# Patient Record
Sex: Female | Born: 1985 | Race: Black or African American | Hispanic: No | Marital: Single | Smoking: Current every day smoker
Health system: Southern US, Community
[De-identification: ages and names within clinical notes are randomized; demographics above are authoritative.]

## PROBLEM LIST (undated history)

## (undated) DIAGNOSIS — I82409 Acute embolism and thrombosis of unspecified deep veins of unspecified lower extremity: Secondary | ICD-10-CM

## (undated) DIAGNOSIS — M109 Gout, unspecified: Secondary | ICD-10-CM

## (undated) DIAGNOSIS — I2699 Other pulmonary embolism without acute cor pulmonale: Secondary | ICD-10-CM

## (undated) HISTORY — PX: OTHER SURGICAL HISTORY: SHX169

---
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
## 2004-11-04 ENCOUNTER — Encounter: Admission: RE | Admit: 2004-11-04 | Discharge: 2004-11-04 | Payer: Self-pay | Admitting: Family Medicine

## 2005-07-13 ENCOUNTER — Ambulatory Visit: Payer: Self-pay | Admitting: Internal Medicine

## 2005-09-16 ENCOUNTER — Ambulatory Visit (HOSPITAL_COMMUNITY): Admission: RE | Admit: 2005-09-16 | Discharge: 2005-09-16 | Payer: Self-pay | Admitting: Obstetrics & Gynecology

## 2005-10-28 ENCOUNTER — Ambulatory Visit (HOSPITAL_COMMUNITY): Admission: RE | Admit: 2005-10-28 | Discharge: 2005-10-28 | Payer: Self-pay | Admitting: Obstetrics

## 2005-12-30 ENCOUNTER — Ambulatory Visit (HOSPITAL_COMMUNITY): Admission: RE | Admit: 2005-12-30 | Discharge: 2005-12-30 | Payer: Self-pay | Admitting: Obstetrics & Gynecology

## 2006-03-23 ENCOUNTER — Inpatient Hospital Stay (HOSPITAL_COMMUNITY): Admission: AD | Admit: 2006-03-23 | Discharge: 2006-03-27 | Payer: Self-pay | Admitting: Obstetrics & Gynecology

## 2006-03-23 ENCOUNTER — Encounter (INDEPENDENT_AMBULATORY_CARE_PROVIDER_SITE_OTHER): Payer: Self-pay | Admitting: *Deleted

## 2006-09-20 ENCOUNTER — Emergency Department (HOSPITAL_COMMUNITY): Admission: EM | Admit: 2006-09-20 | Discharge: 2006-09-20 | Payer: Self-pay | Admitting: Family Medicine

## 2008-11-10 ENCOUNTER — Inpatient Hospital Stay (HOSPITAL_COMMUNITY): Admission: EM | Admit: 2008-11-10 | Discharge: 2008-11-12 | Payer: Self-pay | Admitting: Emergency Medicine

## 2008-11-11 ENCOUNTER — Encounter (INDEPENDENT_AMBULATORY_CARE_PROVIDER_SITE_OTHER): Payer: Self-pay | Admitting: Internal Medicine

## 2008-11-12 ENCOUNTER — Ambulatory Visit: Payer: Self-pay | Admitting: Vascular Surgery

## 2008-11-20 DIAGNOSIS — E669 Obesity, unspecified: Secondary | ICD-10-CM | POA: Insufficient documentation

## 2008-11-20 DIAGNOSIS — I82409 Acute embolism and thrombosis of unspecified deep veins of unspecified lower extremity: Secondary | ICD-10-CM | POA: Insufficient documentation

## 2008-11-20 DIAGNOSIS — I2699 Other pulmonary embolism without acute cor pulmonale: Secondary | ICD-10-CM | POA: Insufficient documentation

## 2008-11-21 ENCOUNTER — Ambulatory Visit: Payer: Self-pay | Admitting: Critical Care Medicine

## 2008-11-21 LAB — CONVERTED CEMR LAB
AntiThromb III Func: 103 % (ref 76–126)
Anticardiolipin IgA: 5 (ref <10)
Anticardiolipin IgG: 9 (ref <10)
Protein S Activity: 115 % (ref 69–129)
Protein S Ag, Total: 150 % — ABNORMAL HIGH (ref 70–140)

## 2008-11-28 ENCOUNTER — Ambulatory Visit: Payer: Self-pay | Admitting: Oncology

## 2008-12-10 ENCOUNTER — Encounter: Payer: Self-pay | Admitting: Critical Care Medicine

## 2008-12-10 LAB — COMPREHENSIVE METABOLIC PANEL
Albumin: 3.6 g/dL (ref 3.5–5.2)
BUN: 7 mg/dL (ref 6–23)
Calcium: 9.3 mg/dL (ref 8.4–10.5)
Chloride: 107 mEq/L (ref 96–112)
Glucose, Bld: 117 mg/dL — ABNORMAL HIGH (ref 70–99)
Potassium: 3.7 mEq/L (ref 3.5–5.3)

## 2008-12-10 LAB — CBC & DIFF AND RETIC
Basophils Absolute: 0 10*3/uL (ref 0.0–0.1)
Eosinophils Absolute: 0.1 10*3/uL (ref 0.0–0.5)
HGB: 13.1 g/dL (ref 11.6–15.9)
LYMPH%: 31.5 % (ref 14.0–49.7)
MCV: 79.5 fL (ref 79.5–101.0)
MONO%: 7 % (ref 0.0–14.0)
NEUT#: 3.6 10*3/uL (ref 1.5–6.5)
NEUT%: 59.3 % (ref 38.4–76.8)
Platelets: 236 10*3/uL (ref 145–400)

## 2008-12-10 LAB — MORPHOLOGY
PLT EST: ADEQUATE
RBC Comments: NORMAL

## 2008-12-11 LAB — D-DIMER, QUANTITATIVE: D-Dimer, Quant: 0.56 ug/mL-FEU — ABNORMAL HIGH (ref 0.00–0.48)

## 2008-12-11 LAB — ANTITHROMBIN III: AntiThromb III Func: 107 % (ref 76–126)

## 2008-12-14 LAB — PROTEIN S, TOTAL: Protein S Ag, Total: 157 % — ABNORMAL HIGH (ref 70–140)

## 2008-12-14 LAB — PROTEIN C, TOTAL: Protein C, Total: 101 % (ref 70–140)

## 2009-05-14 ENCOUNTER — Encounter: Payer: Self-pay | Admitting: Critical Care Medicine

## 2009-05-14 ENCOUNTER — Telehealth (INDEPENDENT_AMBULATORY_CARE_PROVIDER_SITE_OTHER): Payer: Self-pay | Admitting: *Deleted

## 2009-12-03 ENCOUNTER — Ambulatory Visit: Payer: Self-pay | Admitting: Oncology

## 2009-12-07 ENCOUNTER — Encounter: Payer: Self-pay | Admitting: Critical Care Medicine

## 2009-12-07 LAB — CBC WITH DIFFERENTIAL/PLATELET
Basophils Absolute: 0 10*3/uL (ref 0.0–0.1)
EOS%: 2.2 % (ref 0.0–7.0)
Eosinophils Absolute: 0.1 10*3/uL (ref 0.0–0.5)
HCT: 37.2 % (ref 34.8–46.6)
HGB: 12.4 g/dL (ref 11.6–15.9)
MCH: 27.7 pg (ref 25.1–34.0)
NEUT#: 2.7 10*3/uL (ref 1.5–6.5)
NEUT%: 47.2 % (ref 38.4–76.8)
RDW: 17.1 % — ABNORMAL HIGH (ref 11.2–14.5)
lymph#: 2.3 10*3/uL (ref 0.9–3.3)

## 2010-03-16 NOTE — Progress Notes (Signed)
Summary: Dr. Daphine Deutscher Calling  Phone Note From Other Clinic   Caller: Dr. Daphine Deutscher at [facility] Call For: Dr Lynelle Doctor Nurse Summary of Call: Spoke with Dr. Daphine Deutscher at [facility].  Per Dr. Daphine Deutscher, she received a call from Dr. Delford Field a few days ago to restart pt's coumadin-pt is a research study drug pt.  Dr. Daphine Deutscher requesting pt's current PT/INR  from [facility] or Tues of this week.  Per Dr. Daphine Deutscher, pt stated she had these labs drawn at [facility].  I do not see these labs in EMR and no labs in [place].  Call Medical Records - they are looking to see if pt has any paper work that needs to be scanned into EMR and will call me back.  Pt in office now - I will call Dr. Daphine Deutscher back at [phone] ASAP.   Initial call taken by: Gweneth Dimitri RN,  May 14, 2009 10:34 AM  Follow-up for Phone Call        [place], Labspoke with Cordelia Pen.  Per Cordelia Pen, pt was in this week to have these labs drawn.  Because pt is a research pt, labs are drawn and then given to research nurse to send off. Spoke with Dr. Delford Field regarding this, will forward message to him and then will call Dr. Daphine Deutscher back.    Follow-up by: Gweneth Dimitri RN,  May 14, 2009 10:38 AM  Additional Follow-up for Phone Call Additional follow up Details #1::        Call dr Daphine Deutscher back, the pt has a normal INR now. the research drug she was on does not affect protime. she can load and start coumadin at anytime. Only labs we have were liver functions. I will get copy of our labs to dr Katharine Look at [phone] is the research coordinator contact # for our research lab results  Additional Follow-up by: Storm Frisk MD,  May 14, 2009 10:49 AM    Additional Follow-up for Phone Call Additional follow up Details #2::    Called,  Dr. Ermalene Searing office Back.  Per Dr. Pecola Leisure, Dr. Daphine Deutscher is with a pt but she will take a message.  Informed her of above statements per PW and informed her I will get pt's current labs from research nurse  and fax to her.  She  verbalized understanding and is ok with this.  Fax # [phone].  Called [facility] - Per Randye Lobo she will fax pt's labs from Monday to triage fax number.  Will await labs.  Gweneth Dimitri RN  May 14, 2009 11:03 AM  Labs received form Randye Lobo and Faxed to Dr. Ermalene Searing attn at [phone]. Gweneth Dimitri RN  May 14, 2009 11:13 AM

## 2010-03-16 NOTE — Letter (Signed)
Summary: [facility]   Imported By: Sherian Rein 05/21/2009 12:27:32  _____________________________________________________________________  External Attachment:    Type:   Image     Comment:   External Document

## 2010-05-21 LAB — DIFFERENTIAL
Basophils Relative: 1 % (ref 0–1)
Lymphocytes Relative: 36 % (ref 12–46)
Lymphs Abs: 2.3 10*3/uL (ref 0.7–4.0)
Monocytes Absolute: 0.5 10*3/uL (ref 0.1–1.0)
Monocytes Relative: 8 % (ref 3–12)
Neutro Abs: 3.4 10*3/uL (ref 1.7–7.7)
Neutrophils Relative %: 54 % (ref 43–77)

## 2010-05-21 LAB — BASIC METABOLIC PANEL
Calcium: 9.1 mg/dL (ref 8.4–10.5)
Creatinine, Ser: 0.97 mg/dL (ref 0.4–1.2)
GFR calc Af Amer: >60 mL/min (ref >60)
GFR calc non Af Amer: >60 mL/min (ref >60)
Sodium: 143 mEq/L (ref 135–145)

## 2010-05-21 LAB — COMPREHENSIVE METABOLIC PANEL
ALT: 16 U/L (ref 0–35)
ALT: 19 U/L (ref 0–35)
AST: 17 U/L (ref 0–37)
Albumin: 3.1 g/dL — ABNORMAL LOW (ref 3.5–5.2)
Alkaline Phosphatase: 65 U/L (ref 39–117)
Alkaline Phosphatase: 70 U/L (ref 39–117)
GFR calc Af Amer: >60 mL/min (ref >60)
GFR calc non Af Amer: >60 mL/min (ref >60)
Glucose, Bld: 89 mg/dL (ref 70–99)
Potassium: 3.4 mEq/L — ABNORMAL LOW (ref 3.5–5.1)
Potassium: 3.8 mEq/L (ref 3.5–5.1)
Sodium: 136 mEq/L (ref 135–145)
Sodium: 142 mEq/L (ref 135–145)
Total Protein: 6.4 g/dL (ref 6.0–8.3)
Total Protein: 6.5 g/dL (ref 6.0–8.3)

## 2010-05-21 LAB — APTT
aPTT: 24 seconds (ref 24–37)
aPTT: 35 seconds (ref 24–37)

## 2010-05-21 LAB — CBC
Hemoglobin: 11.6 g/dL — ABNORMAL LOW (ref 12.0–15.0)
Hemoglobin: 13.2 g/dL (ref 12.0–15.0)
MCHC: 32.8 g/dL (ref 30.0–36.0)
Platelets: 212 10*3/uL (ref 150–400)
RBC: 4.29 MIL/uL (ref 3.87–5.11)
RBC: 4.91 MIL/uL (ref 3.87–5.11)
RDW: 16.5 % — ABNORMAL HIGH (ref 11.5–15.5)
RDW: 16.7 % — ABNORMAL HIGH (ref 11.5–15.5)
WBC: 6.3 10*3/uL (ref 4.0–10.5)

## 2010-05-21 LAB — URINALYSIS, ROUTINE W REFLEX MICROSCOPIC
Hgb urine dipstick: NEGATIVE
Nitrite: NEGATIVE
Protein, ur: NEGATIVE mg/dL
Urobilinogen, UA: 0.2 mg/dL (ref 0.0–1.0)

## 2010-05-21 LAB — PROTIME-INR
INR: 1.1 (ref 0.00–1.49)
INR: 1.1 (ref 0.00–1.49)
Prothrombin Time: 14.3 seconds (ref 11.6–15.2)

## 2010-05-21 LAB — CARDIAC PANEL(CRET KIN+CKTOT+MB+TROPI)
CK, MB: 1.2 ng/mL (ref 0.3–4.0)
CK, MB: 1.4 ng/mL (ref 0.3–4.0)
Relative Index: 1 (ref 0.0–2.5)
Relative Index: 1.1 (ref 0.0–2.5)
Troponin I: 0.14 ng/mL — ABNORMAL HIGH (ref 0.00–0.06)

## 2010-05-21 LAB — POCT CARDIAC MARKERS
CKMB, poc: 2.9 ng/mL (ref 1.0–8.0)
Myoglobin, poc: 75.1 ng/mL (ref 12–200)
Troponin i, poc: 0.07 ng/mL (ref 0.00–0.09)

## 2010-05-21 LAB — URINE MICROSCOPIC-ADD ON

## 2010-05-21 LAB — PREGNANCY, URINE: Preg Test, Ur: NEGATIVE

## 2010-05-21 LAB — RAPID URINE DRUG SCREEN, HOSP PERFORMED
Amphetamines: NOT DETECTED
Barbiturates: NOT DETECTED
Benzodiazepines: NOT DETECTED
Cocaine: NOT DETECTED

## 2010-05-21 LAB — TSH: TSH: 2.077 u[IU]/mL (ref 0.350–4.500)

## 2010-07-02 NOTE — Op Note (Signed)
Hannah Hubbard, Hannah Hubbard             ACCOUNT NO.:  [id number]   MEDICAL RECORD NO.:  [id number]          PATIENT TYPE:  INP   LOCATION:  9125                          FACILITY:  [facility]   PHYSICIAN:  Roseanna Rainbow, M.D.DATE OF BIRTH:  10-18-1985   DATE OF PROCEDURE:  03/23/2006  DATE OF DISCHARGE:                               OPERATIVE REPORT   PREOPERATIVE DIAGNOSES:  1. Intrauterine pregnancy at term.  2. Suspicious fetal heart tracing with severe variable deceleration.  3. Latent labor.  4. Desires a sterilization procedure.   POSTOPERATIVE DIAGNOSES:  1. Intrauterine pregnancy at term.  2. Suspicious fetal heart tracing with severe variable deceleration.  3. Latent labor.  4. Desires a sterilization procedure.   </PROCEDURES>  1. Primary low uterine flap elliptical cesarean delivery.  2. Modified Pomeroy bilateral tubal ligation via Pfannenstiel skin      incision.   SURGEONS:  Roseanna Rainbow, M.D., Bing Neighbors. Clearance Coots, M.D.   ANESTHESIA:  Epidural.   PATHOLOGY:  Placenta and portions of fallopian tubes.   ESTIMATED BLOOD LOSS:  600 mL.   COMPLICATIONS:  None.   PROCEDURE:  The patient is taken to the operating room with an IV  running and an epidural catheter in situ.  She was placed in the dorsal  supine position with a leftward tilt and prepped and draped in the usual  sterile fashion.  A Pfannenstiel skin incision was then made with the  scalpel and carried down to the underlying fascia with the Bovie.  The  fascia was nicked in the midline.  The fascial incision was then  extended bilaterally with curved Mayo scissors.  The superior aspect of  the fascial incision was tented up and the underlying rectus muscles  dissected off.  The inferior aspect of the fascial incision was  manipulated in a similar fashion.  The rectus muscles were separated in  the midline.  The parietal peritoneum was entered bluntly and extended  bluntly.  The bladder blade was  then placed.  The vesicouterine  peritoneum was tented up and entered sharply with Metzenbaum scissors.  This incision was then extended bilaterally and a bladder flap created  digitally.  The bladder blade was replaced.  The lower uterine segment  was incised in a transverse fashion with the scalpel.  The uterine  incision was then extended bluntly.  The infant's head was delivered  atraumatically.  The oropharynx was suctioned with bulb suction.  The  cord was clamped and cut.  The infant was handed off to the waiting  neonatologist.  An umbilical artery pH was sent.  The placenta was then  removed.  The intrauterine cavity was evacuated of any remaining  amniotic fluid, clots and debris with a moistened laparotomy sponge.  The uterine incision was then reapproximated in a running interlocking  fashion using 0 Monocryl.  Adequate hemostasis was noted.  The  midisthmic portion of the left fallopian tube was then grasped with a  Babcock clamp.  A 2 cm segment of tube was then doubly ligated with 0  plain and excised.  Adequate hemostasis was noted.  The right fallopian  tube was manipulated in a similar fashion.  The parietal peritoneum was  reapproximated in a running fashion using 2-0 Vicryl.  The fascia was  reapproximated in a running fashion using 0 Vicryl.  The skin was closed  with staples.  At the close of the procedure the instrument and pack  counts were said to be correct x2.  Cefazolin 1 g had been given at cord  clamp.  The patient was taken to the PACU awake and in stable condition.      Roseanna Rainbow, M.D.  Electronically Signed     LAJ/MEDQ  D:  03/23/2006  T:  03/23/2006  Job:  [id number]

## 2010-07-02 NOTE — Consult Note (Signed)
   [name], Hannah Hubbard                         ACCOUNT NO.:  [id number]   MEDICAL RECORD NO.:  [id number]                   PATIENT TYPE:  INP   LOCATION:  5725                                 FACILITY:  [facility]   PHYSICIAN:  Tanya D. Daphine Deutscher, M.D.               DATE OF BIRTH:  09-27-1985   DATE OF CONSULTATION:  DATE OF DISCHARGE:  07/23/2002                                   CONSULTATION   ADMISSION DIAGNOSIS:  Deep venous thrombosis.   SECONDARY DIAGNOSES:  None.   PRIMARY PROCEDURES:  Doppler.   SECONDARY PROCEDURES:  Anticoagulation.   PRIMARY CONSULTANTS:  None.   HISTORY OF PRESENT ILLNESS:  Briefly, this is an 25 year old female who  presented to the ED with right thigh swelling and pain for 24 hours.  She  states that the pain occurred while lifting weight.  Subsequent tests  revealed a right femoral DVT.   HOSPITAL COURSE:  The patient was admitted and placed on IV heparin and  Coumadin.  It was difficult to obtain therapeutic levels on this patient in  that her PT INR would not rise, however, the heparin level revealed that the  patient eventually became therapeutic on her heparin.  Coumadin was started.  The patient's hospital stay was prolonged by the fact that the patient has a  history of noncompliance and neither she or her mom were willing to give  Lovenox injection to the patient until she became therapeutic on Coumadin.  On hospital day #5, the patient agreed to Lovenox injection and was  discharged to home with 10 mg of Coumadin and home health followup in order  to recheck PT INR in three days.  The patient was instructed to follow up  with myself in one week and to call if there are any concerns.                                               Tanya D. Daphine Deutscher, M.D.    TDM/MEDQ  D:  08/28/2002  T:  08/28/2002  Job:  [id number]

## 2010-07-02 NOTE — Discharge Summary (Signed)
Hannah Hubbard, Hannah Hubbard             ACCOUNT NO.:  [id number]   MEDICAL RECORD NO.:  [id number]          PATIENT TYPE:  INP   LOCATION:  9125                          [facility]ACILITY[facility]  [facility]   PHYSICIAN:  Charles A. Clearance Coots, M.D.DATE OF BIRTH:  [birth year]   DATE OF ADMISSION:  03/23/2006  DATE OF DISCHARGE:  03/27/2006                               DISCHARGE SUMMARY   ADMISSION DIAGNOSES:  1. Term pregnancy.  2. History of deep venous thrombosis.  3. Spontaneous rupture of membranes in early labor.  4. Desired permanent sterilization.   DISCHARGE DIAGNOSIS:  1. Term pregnancy.  2. History of deep venous thrombosis.  3. Spontaneous rupture of membranes in early labor.  4. Desired permanent sterilization.  5. Status post primary low transverse cesarean section and modified      Pomeroy bilateral tubal ligation on March 23, 2006 for suspicious      fetal heart tracing with severe variable decelerations. Viable female      delivered at 12:49.  Apgars of 9 at 1 minute and 9 at 5 minutes,      weight of 3561 grams, length of 49 cm.  Mother and infant      discharged home in good condition.   REASON FOR ADMISSION:  A 25 year old para 1 female estimated date of  confinement of April 04, 2006 presented at [redacted] weeks gestation with  complaint of spontaneous rupture of membranes and uterine contractions.   PAST MEDICAL HISTORY:  Significant for previous deep venous thrombosis  on heparin therapy and these were also the obstetrical risk factors  along with borderline elevated blood pressures.   PRENATAL LABS:  Significant for normal anticardiolipin antibody,  homocysteine levels were normal, lupus anticoagulant was not detected,  protein C and protein S were normal, factor V levels were normal.  Hemoglobin was 11.6, platelets were 291,000. The group B strep was  negative. 24-hour urine was normal. ABO and Rh status was A+.   PAST MEDICAL HISTORY:  Surgery; none.  Illnesses; none.   MEDICATIONS:   Prenatal vitamins.   ALLERGIES:  NO KNOWN DRUG ALLERGIES   SOCIAL HISTORY:  Negative for tobacco, alcohol or recreational drug use.   PAST GYNECOLOGIC HISTORY:  Significant for positive GC.   PHYSICAL EXAM:  GENERAL:  Well-nourished, well-developed female in no  acute distress.  VITAL SIGNS:  She is afebrile, vital signs were stable.  Fetal heart  tones were 140-158 beats per minute with mild variable decelerations.  The tocodynameter at term revealed irregular uterine contractions.  LUNGS:  Clear to auscultation bilaterally.  HEART: Regular rate and rhythm.  ABDOMEN:  Gravid, nontender.  PELVIC:  Cervix was 3 cm dilated, 90% effaced and vertex was at -2  station.   ADMITTING LABS:  PT was 12.5, hemoglobin was 12.2. INR was 0.9, RPR was  nonreactive.   HOSPITAL COURSE:  The patient was admitted and started on Pitocin  augmentation of labor. Epidural was given for pain management. Fetal  heart tracing developed severe variable decelerations and the decision  was made to proceed with cesarean section delivery for severe variable  fetal heart rate  decelerations.  The patient desired permanent  sterilization. Primary low transverse cesarean section and bilateral  partial salpingectomy was performed on March 23, 2006.  There were no  intraoperative complications. Postoperative course was uncomplicated and  the patient was discharged home on postop day #4 on anticoagulation  therapy to be managed by the coagulation team.   DISCHARGE LABORATORY VALUES:  Hemoglobin 11.3, hematocrit 33, white  blood cell count 8900, platelets 175,000.   DISCHARGE MEDICATIONS:  Continue prenatal vitamins, anti anticoagulation  therapy, Lovenox, and Coumadin per pharmacy protocol.   DISPOSITION:  The patient is to call office for a follow-up appointment  in 2 weeks.      Charles A. Clearance Coots, M.D.  Electronically Signed     CAH/MEDQ  D:  04/13/2006  T:  04/13/2006  Job:  [id number]

## 2011-02-05 ENCOUNTER — Encounter: Payer: Self-pay | Admitting: *Deleted

## 2011-02-05 ENCOUNTER — Emergency Department (INDEPENDENT_AMBULATORY_CARE_PROVIDER_SITE_OTHER)
Admission: EM | Admit: 2011-02-05 | Discharge: 2011-02-05 | Disposition: A | Discharge status: Home / Self Care | Payer: Medicaid Other | Source: Home / Self Care

## 2011-02-05 DIAGNOSIS — K1120 Sialoadenitis, unspecified: Secondary | ICD-10-CM

## 2011-02-05 DIAGNOSIS — K1121 Acute sialoadenitis: Secondary | ICD-10-CM

## 2011-02-05 HISTORY — DX: Other pulmonary embolism without acute cor pulmonale: I26.99

## 2011-02-05 NOTE — ED Provider Notes (Signed)
History     CSN: [id number]  Arrival date & time 02/05/11  4098   None     Chief Complaint  Patient presents with  . Lymphadenopathy    (Consider location/radiation/quality/duration/timing/severity/associated sxs/prior treatment) HPI Comments: Onset yesterday of swelling and tenderness bilateral sides of face. No oral pain, mouth sores or drainage in mouth.  Pt states that she was ill a few days ago with fever, nasal congestion and cough but symptoms have improved. No sore throat or ear pain.    Past Medical History  Diagnosis Date  . Pulmonary embolism     Past Surgical History  Procedure Date  . Btl   . Cesarean section     History reviewed. No pertinent family history.  History  Substance Use Topics  . Smoking status: Current Everyday Smoker  . Smokeless tobacco: Not on file  . Alcohol Use: Yes    OB History    Grav Para Term Preterm Abortions TAB SAB Ect Mult Living                  Review of Systems  Constitutional: Positive for fever. Negative for chills.  HENT: Negative for ear pain, congestion, sore throat, rhinorrhea and sinus pressure.   Respiratory: Negative for cough.   Cardiovascular: Negative for chest pain.  Gastrointestinal: Negative for nausea, vomiting and abdominal pain.    Allergies  Estrogens  Home Medications   Current Outpatient Rx  Name Route Sig Dispense Refill  . WARFARIN SODIUM 7.5 MG PO TABS Oral Take 7.5 mg by mouth daily.        BP 133/90  Pulse 80  Temp(Src) 98.8 F (37.1 C) (Oral)  Resp 16  SpO2 100%  Physical Exam  Nursing note and vitals reviewed. Constitutional: She appears well-developed and well-nourished. No distress.  HENT:  Head: Normocephalic and atraumatic.  Right Ear: Tympanic membrane, external ear and ear canal normal.  Left Ear: Tympanic membrane, external ear and ear canal normal.  Nose: Nose normal.  Mouth/Throat: Uvula is midline, oropharynx is clear and moist and mucous membranes are  normal. No oropharyngeal exudate, posterior oropharyngeal edema or posterior oropharyngeal erythema.       Bilat mild parotid swelling with TTP  Neck: Neck supple.  Cardiovascular: Normal rate, regular rhythm and normal heart sounds.   Pulmonary/Chest: Effort normal and breath sounds normal. No respiratory distress.  Lymphadenopathy:       Head (right side): No submandibular and no tonsillar adenopathy present.       Head (left side): No submandibular and no tonsillar adenopathy present.    She has no cervical adenopathy.  Neurological: She is alert.  Skin: Skin is warm and dry.  Psychiatric: She has a normal mood and affect.    ED Course  Procedures (including critical care time)  Labs Reviewed - No data to display No results found.   1. Acute parotitis       MDM  Bilat parotitis with recent fever and URI symptoms.         Melody Comas, [place] 02/05/11 2126

## 2011-02-05 NOTE — ED Notes (Signed)
Pt  Has  Swelling   Both  Parotid   Gland   Area      The area    Is  Tender to  Touch  She  denys  Any  sorethroat  Or  Any  Toothache  Symptoms

## 2011-02-09 ENCOUNTER — Emergency Department (HOSPITAL_COMMUNITY): Payer: Medicaid Other

## 2011-02-09 ENCOUNTER — Encounter (HOSPITAL_COMMUNITY): Payer: Self-pay | Admitting: Emergency Medicine

## 2011-02-09 ENCOUNTER — Emergency Department (HOSPITAL_COMMUNITY)
Admission: EM | Admit: 2011-02-09 | Discharge: 2011-02-09 | Disposition: A | Discharge status: Home / Self Care | Payer: Medicaid Other | Attending: Emergency Medicine | Admitting: Emergency Medicine

## 2011-02-09 DIAGNOSIS — R221 Localized swelling, mass and lump, neck: Secondary | ICD-10-CM | POA: Insufficient documentation

## 2011-02-09 DIAGNOSIS — R509 Fever, unspecified: Secondary | ICD-10-CM | POA: Insufficient documentation

## 2011-02-09 DIAGNOSIS — J029 Acute pharyngitis, unspecified: Secondary | ICD-10-CM | POA: Insufficient documentation

## 2011-02-09 DIAGNOSIS — I82409 Acute embolism and thrombosis of unspecified deep veins of unspecified lower extremity: Secondary | ICD-10-CM | POA: Insufficient documentation

## 2011-02-09 DIAGNOSIS — R220 Localized swelling, mass and lump, head: Secondary | ICD-10-CM | POA: Insufficient documentation

## 2011-02-09 HISTORY — DX: Acute embolism and thrombosis of unspecified deep veins of unspecified lower extremity: I82.409

## 2011-02-09 LAB — POCT I-STAT, CHEM 8
BUN: 7 mg/dL (ref 6–23)
Chloride: 103 mEq/L (ref 96–112)
Creatinine, Ser: 0.9 mg/dL (ref 0.50–1.10)
Sodium: 141 mEq/L (ref 135–145)
TCO2: 28 mmol/L (ref 0–100)

## 2011-02-09 LAB — CBC
HCT: 36.6 % (ref 36.0–46.0)
MCV: 81.5 fL (ref 78.0–100.0)
Platelets: 277 10*3/uL (ref 150–400)
RBC: 4.49 MIL/uL (ref 3.87–5.11)
WBC: 5.8 10*3/uL (ref 4.0–10.5)

## 2011-02-09 LAB — DIFFERENTIAL
Eosinophils Relative: 2 % (ref 0–5)
Lymphocytes Relative: 35 % (ref 12–46)
Lymphs Abs: 2 10*3/uL (ref 0.7–4.0)
Neutro Abs: 3.1 10*3/uL (ref 1.7–7.7)

## 2011-02-09 MED ORDER — HYDROCODONE-HOMATROPINE 5-1.5 MG/5ML PO SYRP: 5.0000 mL | ORAL_SOLUTION | Freq: Four times a day (QID) | ORAL | Status: AC | PRN

## 2011-02-09 MED ORDER — IOHEXOL 300 MG/ML  SOLN
75.0000 mL | Freq: Once | INTRAMUSCULAR | Status: AC | PRN
  Administered 2011-02-09: 75 mL via INTRAVENOUS

## 2011-02-09 MED ORDER — DEXAMETHASONE 4 MG PO TABS
4.0000 mg | ORAL_TABLET | ORAL | Status: AC
  Administered 2011-02-09: 4 mg via ORAL
  Filled 2011-02-09: qty 1

## 2011-02-09 NOTE — ED Notes (Signed)
Patient arrived from CT by nurse tech patient was awaiting transport which was delayed.

## 2011-02-09 NOTE — ED Notes (Signed)
Patient seen in urgent care for gland infection stated to take tylenol for pain and ice neck.  Patient states pain currently 4/10 achy with swelling increasing overtime. Airway intact bilateral equal chest rise and fall.  Patient states takes coumadin for history of PE and DVT.

## 2011-02-09 NOTE — ED Notes (Signed)
Pt c/o sore throat and swelling of glands that is not getting better; pt sts seen for same and UCC but not getting better; pt appears in nad at present and is handling secretions

## 2011-02-09 NOTE — ED Provider Notes (Addendum)
History     CSN: [id number]  Arrival date & time 02/09/11  1015   First MD Initiated Contact with Patient 02/09/11 1057      Chief Complaint  Patient presents with  . Sore Throat    (Consider location/radiation/quality/duration/timing/severity/associated sxs/prior treatment) HPI Comments: Patient presents with increased swelling of her left neck, worsening of her sore throat, and difficulty swallowing her secretions.  Patient states her voice sounds muffled.Patient denies fever, night sweats, chills, difficulty swallowing or opening mouth, SOB, nuchal rigidity or decreased ROM of neck.  Patient does not have a PCP.    Patient is a 25 y.o. female presenting with pharyngitis. The history is provided by the patient.  Sore Throat Associated symptoms include chills, a fever and a sore throat. Pertinent negatives include no abdominal pain, chest pain, congestion, coughing, diaphoresis, fatigue, headaches, neck pain or rash.    Past Medical History  Diagnosis Date  . Pulmonary embolism   . DVT (deep venous thrombosis)     Past Surgical History  Procedure Date  . Btl   . Cesarean section     History reviewed. No pertinent family history.  History  Substance Use Topics  . Smoking status: Current Everyday Smoker  . Smokeless tobacco: Not on file  . Alcohol Use: Yes    OB History    Grav Para Term Preterm Abortions TAB SAB Ect Mult Living                  Review of Systems  Constitutional: Positive for fever, chills and appetite change. Negative for diaphoresis, activity change and fatigue.  HENT: Positive for sore throat and trouble swallowing. Negative for congestion, facial swelling, rhinorrhea, sneezing, drooling, neck pain, neck stiffness, dental problem, voice change, postnasal drip and sinus pressure.   Eyes: Positive for visual disturbance.  Respiratory: Negative for cough, choking, shortness of breath, wheezing and stridor.   Cardiovascular: Negative for chest  pain.  Gastrointestinal: Negative for abdominal pain.  Skin: Negative for rash.  Neurological: Negative for dizziness and headaches.  Hematological: Positive for adenopathy. Does not bruise/bleed easily.    Allergies  Estrogens  Home Medications   Current Outpatient Rx  Name Route Sig Dispense Refill  . WARFARIN SODIUM 7.5 MG PO TABS Oral Take 11.25 mg by mouth daily.       BP 113/71  Pulse 88  Temp(Src) 98 F (36.7 C) (Oral)  Resp 18  SpO2 99%  Physical Exam  Nursing note and vitals reviewed. Constitutional: She is oriented to person, place, and time. She appears well-developed and well-nourished. No distress.  HENT:  Head: Normocephalic and atraumatic. No trismus in the jaw.  Mouth/Throat: Uvula is midline, oropharynx is clear and moist and mucous membranes are normal. Normal dentition. No dental abscesses or uvula swelling. No oropharyngeal exudate, posterior oropharyngeal edema, posterior oropharyngeal erythema or tonsillar abscesses.       Pt able to open and close mouth with out difficulty. Airway intact. Uvula midline. Mild parotid swelling, ttp.  No swelling or tenderness of submental and submandibular regions.  Eyes: Conjunctivae and EOM are normal.  Neck: Normal range of motion and full passive range of motion without pain. Neck supple.       Airway intact. Able to speak full sentences. No drooling. Flexion and extension of neck with out difficulty and pain free.   Cardiovascular: Normal rate and regular rhythm.   Pulmonary/Chest: Effort normal and breath sounds normal. No stridor. No respiratory distress. She has no wheezes.  Musculoskeletal: Normal range of motion.  Lymphadenopathy:       Head (right side): No submental, no submandibular, no tonsillar, no preauricular and no posterior auricular adenopathy present.       Head (left side): No submental, no submandibular, no tonsillar, no preauricular and no posterior auricular adenopathy present.    She has no  cervical adenopathy.  Neurological: She is alert and oriented to person, place, and time.  Skin: Skin is warm and dry. No rash noted. She is not diaphoretic.    ED Course  Procedures (including critical care time)  Labs Reviewed  CBC - Abnormal; Notable for the following:    RDW 15.7 (*)    All other components within normal limits  DIFFERENTIAL  POCT I-STAT, CHEM 8  I-STAT, CHEM 8   Ct Soft Tissue Neck W Contrast  02/09/2011  *RADIOLOGY REPORT*  Clinical Data: Sore throat.  Difficulty swallowing.  Pain and swelling on the left.  CT NECK WITH CONTRAST  Technique:  Multidetector CT imaging of the neck was performed with intravenous contrast.  Contrast: 75mL OMNIPAQUE IOHEXOL 300 MG/ML IV SOLN  Comparison: None.  Findings: Limited visualization of the intracranial contents does not show any abnormality.  There are some mucosal inflammatory changes of the sphenoid, ethmoid and maxillary sinuses.  Both parotid glands are normal.  Both submandibular glands are intrinsically normal.  There are a few slightly prominent level I and level II lymph nodes on the left consistent with reactive enlargement.  No markedly enlarged nodes or suppurative nodes. There is mild soft tissue swelling in the submandibular space on the left consistent with mild inflammatory change.  No evidence of abscess.  No evidence of tonsillar inflammation.  There is slight asymmetry in the region of the piriform sinuses.  This could be due to some fluid in the left piriform sinus or could be due to some mild swelling or possibly a submucosal cyst.  There are no enlarged lymph nodes lower in the neck.  The thyroid gland is normal.  Larynx appears normal.  I do not see any evidence of dental or periodontal disease.  IMPRESSION: Mild nonspecific soft tissue inflammatory changes in the submandibular space on the left without definable abnormality of the submandibular gland itself.  Mild reactive level I and level II lymphadenopathy on the  left without pronounced enlargement or suppuration.  No evidence of abscess.  Asymmetry of the piriform sinus region on the left.  This could be due to some fluid in the left side of the piriform sinus, possibly a submucosal cyst or least likely localized mucosal inflammation and the piriform sinus.  Mild mucosal inflammation of the sphenoid, maxillary and ethmoid sinuses.  Original Report Authenticated By: Thomasenia Sales, M.D.     No diagnosis found.    MDM  Sore throat   Discussed results with Dr. Judd Lien. Pt non concerning for soft tissue infection spread, afebrile with VSS, adn in NAD.  Pt will be treated symptomatically      Jaci Carrel, PA 02/09/11 1600  [place], [place] 05/20/11 1325

## 2011-02-09 NOTE — ED Provider Notes (Signed)
Medical screening examination/treatment/procedure(s) were performed by non-physician practitioner and as supervising physician I was immediately available for consultation/collaboration.   [facility]; MD   Sharin Grave, MD 02/09/11 [phone]

## 2011-02-10 NOTE — ED Provider Notes (Signed)
Medical screening examination/treatment/procedure(s) were performed by non-physician practitioner and as supervising physician I was immediately available for consultation/collaboration.   Yovana Scogin, MD 02/10/11 0710 

## 2011-05-21 NOTE — ED Provider Notes (Signed)
Medical screening examination/treatment/procedure(s) were performed by non-physician practitioner and as supervising physician I was immediately available for consultation/collaboration.  Mickel Schreur, MD 05/21/11 0654 

## 2011-09-16 ENCOUNTER — Encounter (HOSPITAL_COMMUNITY): Payer: Self-pay | Admitting: Emergency Medicine

## 2011-09-16 ENCOUNTER — Inpatient Hospital Stay (HOSPITAL_COMMUNITY)
Admission: EM | Admit: 2011-09-16 | Discharge: 2011-09-16 | DRG: 301 | Disposition: A | Discharge status: Home / Self Care | Payer: Medicaid Other | Attending: Internal Medicine | Admitting: Internal Medicine

## 2011-09-16 DIAGNOSIS — Z91199 Patient's noncompliance with other medical treatment and regimen due to unspecified reason: Secondary | ICD-10-CM

## 2011-09-16 DIAGNOSIS — I82409 Acute embolism and thrombosis of unspecified deep veins of unspecified lower extremity: Secondary | ICD-10-CM | POA: Diagnosis present

## 2011-09-16 DIAGNOSIS — Z9119 Patient's noncompliance with other medical treatment and regimen: Secondary | ICD-10-CM

## 2011-09-16 DIAGNOSIS — M7989 Other specified soft tissue disorders: Secondary | ICD-10-CM

## 2011-09-16 DIAGNOSIS — M79609 Pain in unspecified limb: Secondary | ICD-10-CM

## 2011-09-16 DIAGNOSIS — I2699 Other pulmonary embolism without acute cor pulmonale: Secondary | ICD-10-CM | POA: Insufficient documentation

## 2011-09-16 DIAGNOSIS — Z86718 Personal history of other venous thrombosis and embolism: Secondary | ICD-10-CM

## 2011-09-16 DIAGNOSIS — E669 Obesity, unspecified: Secondary | ICD-10-CM

## 2011-09-16 DIAGNOSIS — Z7901 Long term (current) use of anticoagulants: Secondary | ICD-10-CM

## 2011-09-16 DIAGNOSIS — I824Z9 Acute embolism and thrombosis of unspecified deep veins of unspecified distal lower extremity: Principal | ICD-10-CM | POA: Diagnosis present

## 2011-09-16 LAB — BASIC METABOLIC PANEL
Calcium: 9.1 mg/dL (ref 8.4–10.5)
Creatinine, Ser: 0.97 mg/dL (ref 0.50–1.10)
GFR calc non Af Amer: 79 mL/min — ABNORMAL LOW (ref >90)
Sodium: 140 mEq/L (ref 135–145)

## 2011-09-16 LAB — PROTIME-INR
INR: 1.09 (ref 0.00–1.49)
Prothrombin Time: 14.3 seconds (ref 11.6–15.2)

## 2011-09-16 LAB — CBC
MCH: 27.3 pg (ref 26.0–34.0)
MCHC: 32.7 g/dL (ref 30.0–36.0)
MCV: 83.6 fL (ref 78.0–100.0)
Platelets: 197 10*3/uL (ref 150–400)
RDW: 17.1 % — ABNORMAL HIGH (ref 11.5–15.5)

## 2011-09-16 MED ORDER — RIVAROXABAN 15 MG PO TABS: 15.0000 mg | ORAL_TABLET | Freq: Every day | ORAL | Status: DC

## 2011-09-16 MED ORDER — ENOXAPARIN SODIUM 150 MG/ML ~~LOC~~ SOLN
1.0000 mg/kg | Freq: Once | SUBCUTANEOUS | Status: AC
  Administered 2011-09-16: 115 mg via SUBCUTANEOUS
  Filled 2011-09-16: qty 1

## 2011-09-16 MED ORDER — MORPHINE SULFATE 4 MG/ML IJ SOLN
4.0000 mg | Freq: Once | INTRAMUSCULAR | Status: AC
  Administered 2011-09-16: 4 mg via INTRAVENOUS
  Filled 2011-09-16: qty 1

## 2011-09-16 MED ORDER — RIVAROXABAN 15 MG PO TABS: 15.0000 mg | ORAL_TABLET | Freq: Once | ORAL | Status: DC

## 2011-09-16 NOTE — Progress Notes (Addendum)
Disposition Note:  Hannah Hubbard, is a 26 y.o. female,   MRN: [id number]  -  DOB - Oct 28, 1985  Outpatient Primary MD for the patient is Altamese Rapides, MD   Blood pressure 127/79, pulse 81, temperature 98.5 F (36.9 C), temperature source Oral, resp. rate 20, SpO2 98.00%.  Active Problems:  DVT    26 yo female w/ history of DVT in 2008 and PE 2010.  Presents with new DVT in Right Popliteal vein.  Patient has been on Rivaroxaban in the past and is now on coumadin.  INR subtherapeutic.  Admit for observation and lovenox.  Med surg bed.  In the mean time, I will also put in an order to case management for Lovenox - - she can possibly be discharged from the ED   Algis Downs, PA-C [facility] Pager: [phone]

## 2011-09-16 NOTE — Progress Notes (Signed)
Received consult request for lovenox. Patient has Medicaid and does not qualify for assistance. Per Drucie Opitz request, I contacted patient's listed PCP, Alwyn Pea MD, to make them aware of patient's condition and need for follow-up.

## 2011-09-16 NOTE — ED Notes (Signed)
Pt resting quietly at this time with no complaints.  Watching television

## 2011-09-16 NOTE — Progress Notes (Signed)
Bilateral lower extremity venous duplex completed.  Preliminary report is positive for DVT in the popliteal vein and superficial thrombus in the lesser saphenous vein.  No evidence of DVT or SVT in the left leg.

## 2011-09-16 NOTE — ED Notes (Signed)
Pt with hx of DVT c/o right calf pain x several days; pt sts taking her coumadin

## 2011-09-16 NOTE — Consult Note (Signed)
Chief Complaint   Patient presents with   .  DVT       HPI Comments: [name] present ambulatory for evaluation of right leg pain and swelling. 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  She reports that she has a throbbing pain in her right calf that has been present for 2-3 days. She is not sure if it is the result of strain from wearing high heeled shoes 3 days ago. She denies any trauma, CP, SOB, syncope, palpitations, fever, rashes and reports her symptoms are limited to the description above.  The history is provided by the patient.She was on xarelto but taken off because it was not FDA approved at the time according to the patient , not because  She had a DVT/PE while she was on xarelto. She received one dose of lovenox, she wants to go home and wants to be switched back to xarelto .  Past Medical History   Diagnosis  Date   .  Pulmonary embolism    .  DVT (deep venous thrombosis)     Past Surgical History   Procedure  Date   .  Btl    .  Cesarean section     History reviewed. No pertinent family history.  History   Substance Use Topics   .  Smoking status:  Current Everyday Smoker   .  Smokeless tobacco:  Not on file   .  Alcohol Use:  Yes    OB History    Grav  Para  Term  Preterm  Abortions  TAB  SAB  Ect  Mult  Living                  Review of Systems  Constitutional: Negative for fever, chills, activity change, appetite change and fatigue.  HENT: Negative for congestion, sore throat, rhinorrhea, neck pain, neck stiffness and sinus pressure.  Eyes: Negative.  Respiratory: Negative for apnea, cough, chest tightness, shortness of breath, wheezing and stridor.  Cardiovascular: Positive for leg swelling. Negative for chest pain and palpitations.  Gastrointestinal: Negative for nausea, vomiting, abdominal  pain, diarrhea and abdominal distention.  Genitourinary: Negative.  Musculoskeletal: Negative for myalgias, back pain, joint swelling, arthralgias and gait problem.  Pain is isolated to left calf and feels similar to previous DVT  Skin: Negative for color change, pallor, rash and wound.  Neurological: Negative. Negative for dizziness, seizures, syncope, weakness and light-headedness.  Hematological: Negative.  Psychiatric/Behavioral: Negative.   Allergies   Estrogens  Home Medications    Current Outpatient Rx   Name  Route  Sig  Dispense  Refill   .  ASPIRIN 325 MG PO TABS  Oral  Take 650 mg by mouth daily as needed. For headache     .  WARFARIN SODIUM 7.5 MG PO TABS  Oral  Take 11.25 mg by mouth daily.      BP 158/86  Pulse 93  Temp 98.5 F (36.9 C) (Oral)  Resp 18  SpO2 97%  Physical Exam  Constitutional: She is oriented to person, place, and time. She appears well-developed and well-nourished. No distress.  HENT:  Head: Normocephalic and atraumatic.  Right Ear: External ear normal.  Left Ear: External ear normal.  Nose: Nose normal.  Mouth/Throat: Oropharynx is clear and moist. No oropharyngeal  exudate.  Eyes: Conjunctivae and EOM are normal. Pupils are equal, round, and reactive to light. Right eye exhibits no discharge. Left eye exhibits no discharge. No scleral icterus.  Neck: Normal range of motion. Neck supple. No JVD present. No tracheal deviation present.  Cardiovascular: Normal rate, regular rhythm, normal heart sounds and intact distal pulses. Exam reveals no gallop and no friction rub.  No murmur heard.  Pulmonary/Chest: Effort normal and breath sounds normal. No stridor. No respiratory distress. She has no wheezes. She has no rales. She exhibits no tenderness.  Abdominal: Soft. Bowel sounds are normal. She exhibits no distension and no mass. There is no tenderness. There is no rebound and no guarding.  Musculoskeletal: Normal range of motion. She exhibits edema and  tenderness.  Entire right leg is slightly larger than left (per pt, this is chronic). Note increased circumference of right calf, with reproducible calf tenderness. Note also trace bilateral edema that is more pronounced on right side. No erythema or skin changes noted. Note intact and symmetric DP and PT pulses.  Lymphadenopathy:  She has no cervical adenopathy.  Neurological: She is alert and oriented to person, place, and time. No cranial nerve deficit. Coordination normal.  Skin: Skin is warm and dry. No rash noted. She is not diaphoretic. No erythema. No pallor.  Psychiatric: She has a normal mood and affect. Her behavior is normal. Judgment and thought content normal.   ED Course   Procedures (including critical care time)   Labs Reviewed   PROTIME-INR    ASSESSMENT /PLAN  1. DVT due to noncompliance with INR follow up  Will start xarelto tonight  She has received one dose of Lovenox

## 2011-09-16 NOTE — ED Notes (Signed)
Patient transported to Ultrasound 

## 2011-09-16 NOTE — ED Notes (Signed)
Pt comfortable  

## 2011-09-16 NOTE — ED Notes (Signed)
Pt resting quietly at this time with no complaints.  Family at bedside.  Pt updated on plan of care

## 2011-09-16 NOTE — ED Notes (Signed)
Returned from vascular lab, positive for DVT per patient, report not in chart yet

## 2011-09-16 NOTE — ED Provider Notes (Signed)
History     CSN: [id number]  Arrival date & time 09/16/11  1117   First MD Initiated Contact with Patient 09/16/11 1154      Chief Complaint  Patient presents with  . DVT    (Consider location/radiation/quality/duration/timing/severity/associated sxs/prior treatment) HPI Comments: Hannah Hubbard present ambulatory for evaluation of right leg pain and swelling.  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  She has had no recent follow-up and her INR has not been checked in over 3 mos.  She reports that she has a throbbing pain in her right calf that has been present for 2-3 days.  She is not sure if it is the result of strain from wearing high heeled shoes 3 days ago.  She denies any trauma, CP, SOB, syncope, palpitations, fever, rashes and reports her symptoms are limited to the description above.  The history is provided by the patient. No language interpreter was used.    Past Medical History  Diagnosis Date  . Pulmonary embolism   . DVT (deep venous thrombosis)     Past Surgical History  Procedure Date  . Btl   . Cesarean section     History reviewed. No pertinent family history.  History  Substance Use Topics  . Smoking status: Current Everyday Smoker  . Smokeless tobacco: Not on file  . Alcohol Use: Yes    OB History    Grav Para Term Preterm Abortions TAB SAB Ect Mult Living                  Review of Systems  Constitutional: Negative for fever, chills, activity change, appetite change and fatigue.  HENT: Negative for congestion, sore throat, rhinorrhea, neck pain, neck stiffness and sinus pressure.   Eyes: Negative.   Respiratory: Negative for apnea, cough, chest tightness, shortness of breath, wheezing and stridor.   Cardiovascular: Positive for leg swelling. Negative for chest pain and palpitations.  Gastrointestinal: Negative for nausea, vomiting, abdominal pain, diarrhea and abdominal distention.  Genitourinary: Negative.     Musculoskeletal: Negative for myalgias, back pain, joint swelling, arthralgias and gait problem.       Pain is isolated to left calf and feels similar to previous DVT  Skin: Negative for color change, pallor, rash and wound.  Neurological: Negative.  Negative for dizziness, seizures, syncope, weakness and light-headedness.  Hematological: Negative.   Psychiatric/Behavioral: Negative.     Allergies  Estrogens  Home Medications   Current Outpatient Rx  Name Route Sig Dispense Refill  . ASPIRIN 325 MG PO TABS Oral Take 650 mg by mouth daily as needed. For headache    . WARFARIN SODIUM 7.5 MG PO TABS Oral Take 11.25 mg by mouth daily.       BP 158/86  Pulse 93  Temp 98.5 F (36.9 C) (Oral)  Resp 18  SpO2 97%  Physical Exam  Constitutional: She is oriented to person, place, and time. She appears well-developed and well-nourished. No distress.  HENT:  Head: Normocephalic and atraumatic.  Right Ear: External ear normal.  Left Ear: External ear normal.  Nose: Nose normal.  Mouth/Throat: Oropharynx is clear and moist. No oropharyngeal exudate.  Eyes: Conjunctivae and EOM are normal. Pupils are equal, round, and reactive to light. Right eye exhibits no discharge. Left eye exhibits no discharge. No scleral icterus.  Neck: Normal range of motion. Neck supple. No JVD present. No tracheal deviation present.  Cardiovascular: Normal rate, regular rhythm, normal heart sounds and  intact distal pulses.  Exam reveals no gallop and no friction rub.   No murmur heard. Pulmonary/Chest: Effort normal and breath sounds normal. No stridor. No respiratory distress. She has no wheezes. She has no rales. She exhibits no tenderness.  Abdominal: Soft. Bowel sounds are normal. She exhibits no distension and no mass. There is no tenderness. There is no rebound and no guarding.  Musculoskeletal: Normal range of motion. She exhibits edema and tenderness.       Entire right leg is slightly larger than left  (per pt, this is chronic).  Note increased circumference of right calf, with reproducible calf tenderness.  Note also trace bilateral edema that is more pronounced on right side.  No erythema or skin changes noted.  Note intact and symmetric DP and PT pulses.   Lymphadenopathy:    She has no cervical adenopathy.  Neurological: She is alert and oriented to person, place, and time. No cranial nerve deficit. Coordination normal.  Skin: Skin is warm and dry. No rash noted. She is not diaphoretic. No erythema. No pallor.  Psychiatric: She has a normal mood and affect. Her behavior is normal. Judgment and thought content normal.    ED Course  Procedures (including critical care time)   Labs Reviewed  PROTIME-INR   No results found.   No diagnosis found.    MDM  Pt, with known hx of DVT, presents for evaluation of atraumatic leg pain and swelling.  She appears nontoxic, stable, NAD.  Plan u/s of right leg.  Will check INR also and manage pain.  If n evidence on new DVT noted will continue pain mgmnt as an outpt and encourage outpt f/u.  Pt is stable, NAD.  She denies symptoms of PE.  U/S demonstrates venous thrombosis of rt lower leg.  Lovenox has been ordered.  Plan admit for further mgmnt.        Tobin Chad, MD 09/16/11 [phone]

## 2012-03-24 ENCOUNTER — Emergency Department (HOSPITAL_COMMUNITY): Payer: Medicaid Other

## 2012-03-24 ENCOUNTER — Encounter (HOSPITAL_COMMUNITY): Payer: Self-pay | Admitting: Emergency Medicine

## 2012-03-24 ENCOUNTER — Emergency Department (HOSPITAL_COMMUNITY)
Admission: EM | Admit: 2012-03-24 | Discharge: 2012-03-24 | Disposition: A | Discharge status: Home / Self Care | Payer: Medicaid Other | Attending: Emergency Medicine | Admitting: Emergency Medicine

## 2012-03-24 DIAGNOSIS — Z86711 Personal history of pulmonary embolism: Secondary | ICD-10-CM | POA: Insufficient documentation

## 2012-03-24 DIAGNOSIS — M79609 Pain in unspecified limb: Secondary | ICD-10-CM | POA: Insufficient documentation

## 2012-03-24 DIAGNOSIS — F17200 Nicotine dependence, unspecified, uncomplicated: Secondary | ICD-10-CM | POA: Insufficient documentation

## 2012-03-24 DIAGNOSIS — R209 Unspecified disturbances of skin sensation: Secondary | ICD-10-CM | POA: Insufficient documentation

## 2012-03-24 DIAGNOSIS — R000 Tachycardia, unspecified: Secondary | ICD-10-CM | POA: Insufficient documentation

## 2012-03-24 DIAGNOSIS — Z86718 Personal history of other venous thrombosis and embolism: Secondary | ICD-10-CM | POA: Insufficient documentation

## 2012-03-24 DIAGNOSIS — Z79899 Other long term (current) drug therapy: Secondary | ICD-10-CM | POA: Insufficient documentation

## 2012-03-24 DIAGNOSIS — M109 Gout, unspecified: Secondary | ICD-10-CM | POA: Insufficient documentation

## 2012-03-24 LAB — CBC WITH DIFFERENTIAL/PLATELET
Basophils Absolute: 0 10*3/uL (ref 0.0–0.1)
Eosinophils Absolute: 0.1 10*3/uL (ref 0.0–0.7)
Eosinophils Relative: 2 % (ref 0–5)
Lymphocytes Relative: 39 % (ref 12–46)
Lymphs Abs: 2.4 10*3/uL (ref 0.7–4.0)
Neutrophils Relative %: 48 % (ref 43–77)
Platelets: 274 10*3/uL (ref 150–400)
RBC: 4.62 MIL/uL (ref 3.87–5.11)
RDW: 16.7 % — ABNORMAL HIGH (ref 11.5–15.5)
WBC: 6.1 10*3/uL (ref 4.0–10.5)

## 2012-03-24 MED ORDER — INDOMETHACIN 50 MG PO CAPS
75.0000 mg | ORAL_CAPSULE | Freq: Once | ORAL | Status: AC
  Administered 2012-03-24: 75 mg via ORAL
  Filled 2012-03-24: qty 1

## 2012-03-24 MED ORDER — INDOMETHACIN 25 MG PO CAPS: 50.0000 mg | ORAL_CAPSULE | Freq: Three times a day (TID) | ORAL | Status: DC

## 2012-03-24 NOTE — ED Provider Notes (Signed)
History     CSN: [id number]  Arrival date & time 03/24/12  1747   First MD Initiated Contact with Patient 03/24/12 2035      Chief Complaint  Patient presents with  . Foot Pain    (Consider location/radiation/quality/duration/timing/severity/associated sxs/prior treatment) HPI  Past Medical History  Diagnosis Date  . Pulmonary embolism   . DVT (deep venous thrombosis)     Past Surgical History  Procedure Laterality Date  . Btl    . Cesarean section      No family history on file.  History  Substance Use Topics  . Smoking status: Current Every Day Smoker    Types: Cigars  . Smokeless tobacco: Never Used  . Alcohol Use: Yes     Comment: ocassionally    OB History   Grav Para Term Preterm Abortions TAB SAB Ect Mult Living                  Review of Systems  Allergies  Estrogens  Home Medications   Current Outpatient Rx  Name  Route  Sig  Dispense  Refill  . acetaminophen (TYLENOL) 500 MG tablet   Oral   Take 500 mg by mouth every 6 (six) hours as needed for pain.         . diphenhydramine-acetaminophen (TYLENOL PM) 25-500 MG TABS   Oral   Take 1 tablet by mouth at bedtime as needed (pain/sleep).         . Rivaroxaban (XARELTO) 15 MG TABS tablet   Oral   Take 15 mg by mouth daily.         . indomethacin (INDOCIN) 25 MG capsule   Oral   Take 2 capsules (50 mg total) by mouth 3 (three) times daily with meals.   30 capsule   0     BP 138/83  Pulse 108  Temp(Src) 98.5 F (36.9 C) (Oral)  Resp 20  SpO2 99%  LMP 03/03/2012  Physical Exam  ED Course  Procedures (including critical care time)  Labs Reviewed  CBC WITH DIFFERENTIAL - Abnormal; Notable for the following:    RDW 16.7 (*)    All other components within normal limits   Dg Foot Complete Left  03/24/2012  *RADIOLOGY REPORT*  Clinical Data: Pain and redness first MTP  LEFT FOOT - COMPLETE 3+ VIEW  Comparison: None.  Findings: Negative for fracture.  Normal alignment.  No  erosion or arthropathy.  IMPRESSION: Negative   Original Report Authenticated By: Janeece Riggers, M.D.      1. Gout       MDM  Gout  CBC normal encouraged patient to FU with PCP next week and to watch for increased bruising due to use of Ella Jubilee, NP 03/24/12 2136

## 2012-03-24 NOTE — ED Notes (Addendum)
Pt presents w/ pain left foot, left great toe and bunion area red, swollen and painful to touch. Pt denies injury of any kind. Endorses hx dvt in right and PE

## 2012-03-24 NOTE — ED Provider Notes (Signed)
History     CSN: [id number]  Arrival date & time 03/24/12  1747   First MD Initiated Contact with Patient 03/24/12 2035      Chief Complaint  Patient presents with  . Foot Pain    (Consider location/radiation/quality/duration/timing/severity/associated sxs/prior treatment) HPI Comments: Acute onset red hot slightly swollen L great toe.  Denies trauma,SOB, CP, previous Hx of gout   Family Hx gout in maternal grandmother   Patient is a 27 y.o. female presenting with lower extremity pain. The history is provided by the patient.  Foot Pain This is a new problem. The current episode started yesterday. The problem occurs constantly. The problem has been unchanged. Associated symptoms include joint swelling and numbness. Pertinent negatives include no chest pain, chills, coughing, fever or weakness. The symptoms are aggravated by exertion. She has tried nothing for the symptoms.    Past Medical History  Diagnosis Date  . Pulmonary embolism   . DVT (deep venous thrombosis)     Past Surgical History  Procedure Laterality Date  . Btl    . Cesarean section      No family history on file.  History  Substance Use Topics  . Smoking status: Current Every Day Smoker    Types: Cigars  . Smokeless tobacco: Never Used  . Alcohol Use: Yes     Comment: ocassionally    OB History   Grav Para Term Preterm Abortions TAB SAB Ect Mult Living                  Review of Systems  Constitutional: Negative for fever and chills.  HENT: Negative.   Eyes: Negative.   Respiratory: Negative for cough.   Cardiovascular: Negative for chest pain and leg swelling.  Musculoskeletal: Positive for joint swelling.  Skin: Positive for color change and wound.  Neurological: Positive for numbness. Negative for weakness.  All other systems reviewed and are negative.    Allergies  Estrogens  Home Medications   Current Outpatient Rx  Name  Route  Sig  Dispense  Refill  . acetaminophen (TYLENOL)  500 MG tablet   Oral   Take 500 mg by mouth every 6 (six) hours as needed for pain.         . diphenhydramine-acetaminophen (TYLENOL PM) 25-500 MG TABS   Oral   Take 1 tablet by mouth at bedtime as needed (pain/sleep).         . Rivaroxaban (XARELTO) 15 MG TABS tablet   Oral   Take 15 mg by mouth daily.           BP 138/83  Pulse 108  Temp(Src) 98.5 F (36.9 C) (Oral)  Resp 20  SpO2 99%  LMP 03/03/2012  Physical Exam  Constitutional: She is oriented to person, place, and time. She appears well-developed and well-nourished.  obese  HENT:  Head: Normocephalic.  Neck: Normal range of motion.  Cardiovascular: Regular rhythm.  Tachycardia present.   Pulmonary/Chest: Effort normal. No respiratory distress. She has no wheezes. She exhibits no tenderness.  Musculoskeletal: She exhibits tenderness. She exhibits no edema.       Left lower leg: Normal. She exhibits no tenderness, no swelling and no edema.       Feet:  Red hot slightly swollen   Neurological: She is alert and oriented to person, place, and time.  Skin: Skin is warm. There is erythema.    ED Course  Procedures (including critical care time)  Labs Reviewed  CBC WITH DIFFERENTIAL  Dg Foot Complete Left  03/24/2012  *RADIOLOGY REPORT*  Clinical Data: Pain and redness first MTP  LEFT FOOT - COMPLETE 3+ VIEW  Comparison: None.  Findings: Negative for fracture.  Normal alignment.  No erosion or arthropathy.  IMPRESSION: Negative   Original Report Authenticated By: Janeece Riggers, M.D.      No diagnosis found.    MDM  PERK negative for DVT at this time will treat for gout with PCP FU        Arman Filter, NP 03/24/12 2350

## 2012-03-24 NOTE — ED Provider Notes (Signed)
History     CSN: [id number]  Arrival date & time 03/24/12  1747   First MD Initiated Contact with Patient 03/24/12 2035      Chief Complaint  Patient presents with  . Foot Pain    (Consider location/radiation/quality/duration/timing/severity/associated sxs/prior treatment) HPI Comments: Acute onset of L great toe pain with redness and pain.  Denies trauma or previous Hx of same Has family Hx of gout on materal grandmother   The history is provided by the patient.    Past Medical History  Diagnosis Date  . Pulmonary embolism   . DVT (deep venous thrombosis)     Past Surgical History  Procedure Laterality Date  . Btl    . Cesarean section      No family history on file.  History  Substance Use Topics  . Smoking status: Current Every Day Smoker    Types: Cigars  . Smokeless tobacco: Never Used  . Alcohol Use: Yes     Comment: ocassionally    OB History   Grav Para Term Preterm Abortions TAB SAB Ect Mult Living                  Review of Systems  Constitutional: Negative for fever and chills.  HENT: Negative.   Eyes: Negative.   Respiratory: Negative for cough and shortness of breath.   Cardiovascular: Negative for leg swelling.  Gastrointestinal: Negative.   Endocrine: Negative.   Musculoskeletal: Positive for joint swelling.  Skin: Negative for wound.  Neurological: Negative for numbness.    Allergies  Estrogens  Home Medications   Current Outpatient Rx  Name  Route  Sig  Dispense  Refill  . acetaminophen (TYLENOL) 500 MG tablet   Oral   Take 500 mg by mouth every 6 (six) hours as needed for pain.         . diphenhydramine-acetaminophen (TYLENOL PM) 25-500 MG TABS   Oral   Take 1 tablet by mouth at bedtime as needed (pain/sleep).         . Rivaroxaban (XARELTO) 15 MG TABS tablet   Oral   Take 15 mg by mouth daily.         . indomethacin (INDOCIN) 25 MG capsule   Oral   Take 2 capsules (50 mg total) by mouth 3 (three) times daily  with meals.   30 capsule   0     BP 138/83  Pulse 108  Temp(Src) 98.5 F (36.9 C) (Oral)  Resp 20  SpO2 99%  LMP 03/03/2012  Physical Exam  Constitutional: She is oriented to person, place, and time. She appears well-developed and well-nourished.  HENT:  Head: Normocephalic.  Eyes: Pupils are equal, round, and reactive to light.  Neck: Normal range of motion.  Cardiovascular: Normal rate.   Pulmonary/Chest: Effort normal and breath sounds normal.  Abdominal: Soft.  Musculoskeletal: She exhibits tenderness. She exhibits no edema.  Neurological: She is alert and oriented to person, place, and time.  Skin: There is erythema.    ED Course  Procedures (including critical care time)  Labs Reviewed  CBC WITH DIFFERENTIAL - Abnormal; Notable for the following:    RDW 16.7 (*)    All other components within normal limits   Dg Foot Complete Left  03/24/2012  *RADIOLOGY REPORT*  Clinical Data: Pain and redness first MTP  LEFT FOOT - COMPLETE 3+ VIEW  Comparison: None.  Findings: Negative for fracture.  Normal alignment.  No erosion or arthropathy.  IMPRESSION: Negative  Original Report Authenticated By: Janeece Riggers, M.D.      1. Gout       MDM   CBC normal pateint encouraged to FU with pcp due to use of indocin and Xarelto         Arman Filter, NP 03/24/12 2138  Arman Filter, NP 03/24/12 2351  Arman Filter, NP 03/24/12 2359

## 2012-03-25 NOTE — ED Provider Notes (Signed)
Medical screening examination/treatment/procedure(s) were performed by non-physician practitioner and as supervising physician I was immediately available for consultation/collaboration.   Richardean Canal, MD 03/25/12 0001

## 2012-03-25 NOTE — ED Provider Notes (Signed)
Medical screening examination/treatment/procedure(s) were performed by non-physician practitioner and as supervising physician I was immediately available for consultation/collaboration.   Margarett Viti H Tayte Mcwherter, MD 03/25/12 0001 

## 2012-03-25 NOTE — ED Provider Notes (Signed)
Medical screening examination/treatment/procedure(s) were performed by non-physician practitioner and as supervising physician I was immediately available for consultation/collaboration.  See updated note.   Richardean Canal, MD 03/25/12 Marlyne Beards

## 2013-08-28 ENCOUNTER — Other Ambulatory Visit: Payer: Self-pay | Admitting: Family Medicine

## 2013-08-28 DIAGNOSIS — R52 Pain, unspecified: Secondary | ICD-10-CM

## 2013-08-28 DIAGNOSIS — R609 Edema, unspecified: Secondary | ICD-10-CM

## 2013-08-30 ENCOUNTER — Ambulatory Visit
Admission: RE | Admit: 2013-08-30 | Discharge: 2013-08-30 | Disposition: A | Discharge status: Home / Self Care | Payer: Medicaid Other | Source: Ambulatory Visit | Attending: Family Medicine | Admitting: Family Medicine

## 2013-08-30 DIAGNOSIS — R609 Edema, unspecified: Secondary | ICD-10-CM

## 2013-08-30 DIAGNOSIS — R52 Pain, unspecified: Secondary | ICD-10-CM

## 2014-03-05 ENCOUNTER — Encounter (HOSPITAL_COMMUNITY): Payer: Self-pay | Admitting: Physical Medicine and Rehabilitation

## 2014-03-05 ENCOUNTER — Emergency Department (HOSPITAL_COMMUNITY)
Admission: EM | Admit: 2014-03-05 | Discharge: 2014-03-05 | Disposition: A | Discharge status: Home / Self Care | Payer: Medicaid Other | Attending: Emergency Medicine | Admitting: Emergency Medicine

## 2014-03-05 DIAGNOSIS — Z720 Tobacco use: Secondary | ICD-10-CM | POA: Insufficient documentation

## 2014-03-05 DIAGNOSIS — I82409 Acute embolism and thrombosis of unspecified deep veins of unspecified lower extremity: Secondary | ICD-10-CM | POA: Diagnosis not present

## 2014-03-05 DIAGNOSIS — Z86711 Personal history of pulmonary embolism: Secondary | ICD-10-CM | POA: Diagnosis not present

## 2014-03-05 DIAGNOSIS — M79601 Pain in right arm: Secondary | ICD-10-CM | POA: Diagnosis present

## 2014-03-05 DIAGNOSIS — Z7901 Long term (current) use of anticoagulants: Secondary | ICD-10-CM | POA: Insufficient documentation

## 2014-03-05 DIAGNOSIS — Z791 Long term (current) use of non-steroidal anti-inflammatories (NSAID): Secondary | ICD-10-CM | POA: Diagnosis not present

## 2014-03-05 DIAGNOSIS — I82701 Chronic embolism and thrombosis of unspecified veins of right upper extremity: Secondary | ICD-10-CM | POA: Insufficient documentation

## 2014-03-05 DIAGNOSIS — I82621 Acute embolism and thrombosis of deep veins of right upper extremity: Secondary | ICD-10-CM

## 2014-03-05 MED ORDER — HYDROCODONE-ACETAMINOPHEN 5-325 MG PO TABS: 1.0000 | ORAL_TABLET | Freq: Once | ORAL | Status: DC

## 2014-03-05 MED ORDER — OXYCODONE-ACETAMINOPHEN 5-325 MG PO TABS: 1.0000 | ORAL_TABLET | ORAL | Status: DC | PRN

## 2014-03-05 MED ORDER — RIVAROXABAN (XARELTO) EDUCATION KIT FOR DVT/PE PATIENTS
PACK | Freq: Once | Status: AC
  Administered 2014-03-05: 17:00:00
  Filled 2014-03-05: qty 1

## 2014-03-05 MED ORDER — XARELTO VTE STARTER PACK 15 & 20 MG PO TBPK: 15.0000 mg | ORAL_TABLET | ORAL | Status: DC

## 2014-03-05 MED ORDER — NAPROXEN 250 MG PO TABS
500.0000 mg | ORAL_TABLET | Freq: Once | ORAL | Status: AC
  Administered 2014-03-05: 500 mg via ORAL
  Filled 2014-03-05: qty 2

## 2014-03-05 MED ORDER — OXYCODONE-ACETAMINOPHEN 5-325 MG PO TABS
1.0000 | ORAL_TABLET | Freq: Once | ORAL | Status: AC
  Administered 2014-03-05: 1 via ORAL
  Filled 2014-03-05: qty 1

## 2014-03-05 NOTE — Discharge Instructions (Signed)
Please follow the directions provided. Is important to call and make a follow-up appointment your primary care doctor for further management of this clot in your arm. Please take this around toe as directed. You may take the pain medicine as needed but be sure not to drive while taking this medicine as it can make you sleepy. Don't hesitate to return for any new, worsening, or concerning symptoms.   SEEK IMMEDIATE MEDICAL CARE IF:  You have chest pain.  You have trouble breathing.  You have new or increased swelling or pain in one leg.  You cough up blood.  You notice blood in vomit, in a bowel movement, or in urine.

## 2014-03-05 NOTE — ED Notes (Signed)
Pt not in the room

## 2014-03-05 NOTE — Progress Notes (Signed)
Right upper extremity venous duplex completed:  DVT noted axillary and brachial veins with superficial thrombosis in the basilic and cephalic veins.

## 2014-03-05 NOTE — ED Provider Notes (Signed)
CSN: [id number]     Arrival date & time 03/05/14  1234 History  This chart was scribed for non-physician practitioner working with Hoy Morn, MD by Molli Posey, ED Scribe. This patient was seen in room TR10C/TR10C and the patient's care was started at 1:41 PM.     Chief Complaint  Patient presents with  . Arm Pain   The history is provided by the patient. No language interpreter was used.   HPI Comments: Hannah Hubbard is a 29 y.o. female with a history of pulmonary embolism and DVT who presents to the Emergency Department complaining of worsening right arm pain that started 5 days ago. She rates her pain as a 9/10 at this time. Pt states that she had an IV placed in her right arm recently when she had wisdom teeth extracted. She says that her right shoulder hurts as well and reports her pain worsens when she lifts her arm. Pt says she has tried using heating pads which has failed to provide her any pain relief. She reports she has a Xarelto medication that she has not filled and has not taken it in the last 4 months. Denies CP and SOB.   Past Medical History  Diagnosis Date  . Pulmonary embolism   . DVT (deep venous thrombosis)    Past Surgical History  Procedure Laterality Date  . Btl    . Cesarean section     No family history on file. History  Substance Use Topics  . Smoking status: Current Every Day Smoker    Types: Cigars  . Smokeless tobacco: Never Used  . Alcohol Use: Yes     Comment: ocassionally   OB History    No data available     Review of Systems  Musculoskeletal: Positive for myalgias.  All other systems reviewed and are negative.   Allergies  Estrogens  Home Medications   Prior to Admission medications   Medication Sig Start Date End Date Taking? Authorizing Provider  acetaminophen (TYLENOL) 500 MG tablet Take 500 mg by mouth every 6 (six) hours as needed for pain.    Historical Provider, MD  diphenhydramine-acetaminophen (TYLENOL PM) 25-500  MG TABS Take 1 tablet by mouth at bedtime as needed (pain/sleep).    Historical Provider, MD  indomethacin (INDOCIN) 25 MG capsule Take 2 capsules (50 mg total) by mouth 3 (three) times daily with meals. 03/24/12   Garald Balding, NP  Rivaroxaban (XARELTO) 15 MG TABS tablet Take 15 mg by mouth daily.    Historical Provider, MD   BP 141/90 mmHg  Pulse 106  Temp(Src) 98 F (36.7 C) (Oral)  Resp 20  Ht _0  (1.702 m)  Wt 245 lb (111.131 kg)  BMI 38.36 kg/m2  SpO2 96% Physical Exam  Constitutional: She is oriented to person, place, and time. She appears well-developed and well-nourished.  HENT:  Head: Normocephalic and atraumatic.  Eyes: Conjunctivae are normal. Right eye exhibits no discharge. Left eye exhibits no discharge.  Neck: No tracheal deviation present.  Cardiovascular: Normal rate, regular rhythm and normal heart sounds.  Exam reveals no gallop and no friction rub.   No murmur heard. Pulses:      Radial pulses are 2+ on the right side, and 2+ on the left side.  Pulmonary/Chest: Effort normal and breath sounds normal. No respiratory distress. She has no wheezes. She has no rales. She exhibits no tenderness.  Abdominal: She exhibits no distension.  Musculoskeletal: She exhibits tenderness.  TTP over right  posterior shoulder, right posterior upper arm, over bicep. No obvious redness or swelling noted.  Neurological: She is alert and oriented to person, place, and time.  Neurovascularly intact distally.   Skin: Skin is warm and dry.  Psychiatric: She has a normal mood and affect. Her behavior is normal.  Nursing note and vitals reviewed.   ED Course  Procedures   DIAGNOSTIC STUDIES: Oxygen Saturation is 96% on RA, normal by my interpretation.    COORDINATION OF CARE: 1:46 PM Discussed treatment plan with pt at bedside and pt agreed to plan.   Labs Review Labs Reviewed - No data to display  Imaging Review:  DVT noted axillary and brachial veins with superficial  thrombosis in the basilic and cephalic veins.    EKG Interpretation None      MDM   Final diagnoses:  DVT of upper extremity (deep vein thrombosis), right   29 yo twith arm pain x 2 days, no recent injury.  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  Discussed case with Dr. Venora Maples.  Upper extremity venous duplex done with identification of several DVT including DVT noted axillary and brachial veins with superficial thrombosis in the basilic and cephalic veins. Pt and family made aware of findings, Xarelto starter pack given with teaching done by pharmacy.  Prescription for pain meds and xarelto provided.  Instructed pt to follow-up with PCP as soon as possible for further mgmt. Pt is well-appearing, in no acute distress and vital signs reviewed and non-concerning. She appears safe to be discharged.  Pt aware of plan and in agreement.  I personally performed the services described in this documentation, which was scribed in my presence. The recorded information has been reviewed and is accurate.  Filed Vitals:   03/05/14 1238 03/05/14 1710  BP: 141/90 141/92  Pulse: 106 89  Temp: 98 F (36.7 C) 98.2 F (36.8 C)  TempSrc: Oral Oral  Resp: 20 16  Height: _0  (1.702 m)   Weight: 245 lb (111.131 kg)   SpO2: 96% 98%   Meds given in ED:  Medications  oxyCODONE-acetaminophen (PERCOCET/ROXICET) 5-325 MG per tablet 1 tablet (1 tablet Oral Given 03/05/14 1337)  naproxen (NAPROSYN) tablet 500 mg (500 mg Oral Given 03/05/14 1410)  rivaroxaban (XARELTO) Education Kit for DVT/PE patients ( Does not apply Given 03/05/14 1640)    Discharge Medication List as of 03/05/2014  3:56 PM    START taking these medications   Details  oxyCODONE-acetaminophen (PERCOCET/ROXICET) 5-325 MG per tablet Take 1 tablet by mouth every 4 (four) hours as needed for moderate pain or severe pain., Starting 03/05/2014, Until  Discontinued, Print    XARELTO STARTER PACK 15 & 20 MG TBPK Take 15-20 mg by mouth as directed. Take as directed on package: Start with one 24m tablet by mouth twice a day with food. On Day 22, switch to one 234mtablet once a day with food., Starting 03/05/2014, Until Discontinued, Print            ElBritt BottomNP 03/06/14 08[place]pton BeachMD 03/07/14 1710

## 2014-03-05 NOTE — ED Notes (Signed)
Pt states R arm pain, states she had IV placed to R arm recently when she had wisdom teeth extracted. Now states increased pain to arm, becomes worse with movement. No swelling noted. 7/10 pain at present.

## 2014-08-11 ENCOUNTER — Encounter (HOSPITAL_COMMUNITY): Payer: Self-pay | Admitting: Emergency Medicine

## 2014-08-11 ENCOUNTER — Emergency Department (HOSPITAL_COMMUNITY)
Admission: EM | Admit: 2014-08-11 | Discharge: 2014-08-11 | Disposition: A | Discharge status: Home / Self Care | Payer: Medicaid Other | Attending: Emergency Medicine | Admitting: Emergency Medicine

## 2014-08-11 ENCOUNTER — Emergency Department (HOSPITAL_BASED_OUTPATIENT_CLINIC_OR_DEPARTMENT_OTHER)
Admit: 2014-08-11 | Discharge: 2014-08-11 | Disposition: A | Discharge status: Home / Self Care | Payer: Medicaid Other | Attending: Physician Assistant | Admitting: Physician Assistant

## 2014-08-11 ENCOUNTER — Emergency Department (HOSPITAL_COMMUNITY): Payer: Medicaid Other

## 2014-08-11 DIAGNOSIS — Z720 Tobacco use: Secondary | ICD-10-CM | POA: Insufficient documentation

## 2014-08-11 DIAGNOSIS — Z86718 Personal history of other venous thrombosis and embolism: Secondary | ICD-10-CM | POA: Diagnosis not present

## 2014-08-11 DIAGNOSIS — M79609 Pain in unspecified limb: Secondary | ICD-10-CM | POA: Diagnosis not present

## 2014-08-11 DIAGNOSIS — Z79899 Other long term (current) drug therapy: Secondary | ICD-10-CM | POA: Diagnosis not present

## 2014-08-11 DIAGNOSIS — M25572 Pain in left ankle and joints of left foot: Secondary | ICD-10-CM | POA: Insufficient documentation

## 2014-08-11 DIAGNOSIS — Z86711 Personal history of pulmonary embolism: Secondary | ICD-10-CM | POA: Diagnosis not present

## 2014-08-11 DIAGNOSIS — M79662 Pain in left lower leg: Secondary | ICD-10-CM | POA: Diagnosis not present

## 2014-08-11 HISTORY — DX: Gout, unspecified: M10.9

## 2014-08-11 LAB — COMPREHENSIVE METABOLIC PANEL
ALT: 13 U/L — AB (ref 14–54)
ANION GAP: 12 (ref 5–15)
AST: 18 U/L (ref 15–41)
Albumin: 3.7 g/dL (ref 3.5–5.0)
Alkaline Phosphatase: 66 U/L (ref 38–126)
BUN: 10 mg/dL (ref 6–20)
CALCIUM: 8.9 mg/dL (ref 8.9–10.3)
CO2: 24 mmol/L (ref 22–32)
CREATININE: 1 mg/dL (ref 0.44–1.00)
Chloride: 102 mmol/L (ref 101–111)
GFR calc Af Amer: >60 mL/min (ref >60)
GFR calc non Af Amer: >60 mL/min (ref >60)
Glucose, Bld: 94 mg/dL (ref 65–99)
Potassium: 3.9 mmol/L (ref 3.5–5.1)
Sodium: 138 mmol/L (ref 135–145)
TOTAL PROTEIN: 7.4 g/dL (ref 6.5–8.1)
Total Bilirubin: 0.6 mg/dL (ref 0.3–1.2)

## 2014-08-11 LAB — CBC WITH DIFFERENTIAL/PLATELET
BASOS ABS: 0 10*3/uL (ref 0.0–0.1)
BASOS PCT: 1 % (ref 0–1)
Eosinophils Absolute: 0.2 10*3/uL (ref 0.0–0.7)
Eosinophils Relative: 3 % (ref 0–5)
HCT: 36.8 % (ref 36.0–46.0)
HEMOGLOBIN: 12.4 g/dL (ref 12.0–15.0)
LYMPHS PCT: 34 % (ref 12–46)
Lymphs Abs: 1.9 10*3/uL (ref 0.7–4.0)
MCH: 27.6 pg (ref 26.0–34.0)
MCHC: 33.7 g/dL (ref 30.0–36.0)
MCV: 82 fL (ref 78.0–100.0)
MONOS PCT: 11 % (ref 3–12)
Monocytes Absolute: 0.6 10*3/uL (ref 0.1–1.0)
NEUTROS ABS: 2.9 10*3/uL (ref 1.7–7.7)
NEUTROS PCT: 51 % (ref 43–77)
Platelets: 302 10*3/uL (ref 150–400)
RBC: 4.49 MIL/uL (ref 3.87–5.11)
RDW: 15.6 % — AB (ref 11.5–15.5)
WBC: 5.6 10*3/uL (ref 4.0–10.5)

## 2014-08-11 MED ORDER — NAPROXEN 500 MG PO TABS: 500.0000 mg | ORAL_TABLET | Freq: Two times a day (BID) | ORAL | Status: DC

## 2014-08-11 MED ORDER — HYDROCODONE-ACETAMINOPHEN 5-325 MG PO TABS
2.0000 | ORAL_TABLET | Freq: Once | ORAL | Status: AC
  Administered 2014-08-11: 2 via ORAL
  Filled 2014-08-11: qty 2

## 2014-08-11 NOTE — ED Provider Notes (Signed)
CSN: [id number]     Arrival date & time 08/11/14  1052 History   First MD Initiated Contact with Patient 08/11/14 1113     Chief Complaint  Patient presents with  . Ankle Pain     (Consider location/radiation/quality/duration/timing/severity/associated sxs/prior Treatment) HPI Comments: Patient presents today with a chief complaint of pain of the left ankle and left lower leg.  She reports that the pain has been constant for the past 2 days.  She describes the pain as an intense ache.  No acute injury or trauma.  Patient reports a history of DVT and PE.  She is currently on Xarelto and reports that she has been compliant with this.  She is very concerned that she may have another DVT.  She denies fever, chills, nausea, vomiting, chest pain, SOB, or hemoptysis.  She also reports a history of Gout, but states that the gout was located at the 1st MTP of her foot.  She states that the symptoms do not feel similar to symptoms she had with gout.  She has not taken anything for pain prior to arrival.  Patient is a [age] y.o. female presenting with ankle pain. The history is provided by the patient.  Ankle Pain   Past Medical History  Diagnosis Date  . Pulmonary embolism   . DVT (deep venous thrombosis)   . Gout    Past Surgical History  Procedure Laterality Date  . Btl    . Cesarean section     No family history on file. History  Substance Use Topics  . Smoking status: Current Every Day Smoker    Types: Cigars  . Smokeless tobacco: Never Used  . Alcohol Use: Yes     Comment: ocassionally   OB History    No data available     Review of Systems  All other systems reviewed and are negative.     Allergies  Estrogens  Home Medications   Prior to Admission medications   Medication Sig Start Date End Date Taking? Authorizing Provider  acetaminophen (TYLENOL) 500 MG tablet Take 500 mg by mouth every 6 (six) hours as needed for pain.    Historical Provider, MD   diphenhydramine-acetaminophen (TYLENOL PM) 25-500 MG TABS Take 1 tablet by mouth at bedtime as needed (pain/sleep).    Historical Provider, MD  indomethacin (INDOCIN) 25 MG capsule Take 2 capsules (50 mg total) by mouth 3 (three) times daily with meals. 03/24/12   Earley FavorGail Schulz, NP  oxyCODONE-acetaminophen (PERCOCET/ROXICET) 5-325 MG per tablet Take 1 tablet by mouth every 4 (four) hours as needed for moderate pain or severe pain. 03/05/14   Harle BattiestElizabeth Tysinger, NP  Rivaroxaban (XARELTO) 15 MG TABS tablet Take 15 mg by mouth daily.    Historical Provider, MD  XARELTO STARTER PACK 15 & 20 MG TBPK Take 15-20 mg by mouth as directed. Take as directed on package: Start with one 15mg  tablet by mouth twice a day with food. On Day 22, switch to one 20mg  tablet once a day with food. 03/05/14   Harle BattiestElizabeth Tysinger, NP   BP 111/78 mmHg  Pulse 76  Temp(Src) 98.2 F (36.8 C) (Oral)  Resp 18  Ht 5\' 7"  (1.702 m)  Wt 250 lb (113.399 kg)  BMI 39.15 kg/m2  SpO2 98%  LMP 07/21/2014 Physical Exam  Constitutional: She appears well-developed and well-nourished.  HENT:  Head: Normocephalic and atraumatic.  Mouth/Throat: Oropharynx is clear and moist.  Neck: Normal range of motion. Neck supple.  Cardiovascular: Normal rate,  regular rhythm and normal heart sounds.   Pulses:      Dorsalis pedis pulses are 2+ on the right side, and 2+ on the left side.  Pulmonary/Chest: Effort normal and breath sounds normal.  Musculoskeletal:       Left knee: She exhibits normal range of motion and no swelling. No tenderness found.  Tenderness to palpation of the left ankle and the left calf.  No erythema, edema, or warmth.  Full ROM of the left ankle.    Neurological: She is alert.  Distal sensation of the left foot intact  Skin: Skin is warm and dry.  Psychiatric: She has a normal mood and affect.  Nursing note and vitals reviewed.   ED Course  Procedures (including critical care time) Labs Review Labs Reviewed  CBC WITH  DIFFERENTIAL/PLATELET - Abnormal; Notable for the following:    RDW 15.6 (*)    All other components within normal limits  COMPREHENSIVE METABOLIC PANEL - Abnormal; Notable for the following:    ALT 13 (*)    All other components within normal limits    Imaging Review Dg Ankle Complete Left  08/11/2014   CLINICAL DATA:  Pain of 2 days duration.  Personal history of gout.  EXAM: LEFT ANKLE COMPLETE - 3+ VIEW  COMPARISON:  Foot radiographs 2014  FINDINGS: No evidence of fracture. There is periosteal elevation affecting the distal tibia. This is most likely due to chronic venous stasis, especially given that there appear to be phleboliths in the soft tissues related to venous varicosities. This could be due to stress fracture or a more aggressive osseous process, though there is no evidence of focal osteolysis and therefore this is unlikely. No evidence of regional trauma or degenerative change.  IMPRESSION: No acute finding. Periosteal elevation of the distal tibia probably related to chronic venous stasis. This can be associated with pain. See above discussion.   Electronically Signed   By: Paulina Fusi M.D.   On: 08/11/2014 12:01     EKG Interpretation None      MDM   Final diagnoses:  None   Patient with a history of DVT/PE currently on Xarelto presents today with LLE pain.  No acute injury or trauma.  Doppler ultrasound negative for DVT.  Xray results as above showing no acute finding, probable chronic venous stasis.  No signs of Gout or other infection on exam.  Full ROM of the ankle.  Neurovascularly intact.  Feel that the patient is stable for discharge.  Return precautions given.    Santiago Glad, PA-C 08/13/14 0850  Santiago Glad, PA-C 08/13/14 1610  Benjiman Core, MD 08/13/14 2202

## 2014-08-11 NOTE — Discharge Instructions (Signed)
Your ultrasound today did not show evidence of a blood clot.  Xray did not show evidence of a bone fracture, but did show changes consistent with venous stasis.  It is recommended that you were compression stockings.

## 2014-08-11 NOTE — ED Notes (Signed)
Pt c/o left ankle pain x 2 days. Pt denies recent injury. Pt has history of gout. Pt reports that she is also having pain in left leg.

## 2014-08-11 NOTE — ED Notes (Signed)
Patient laughing and joking on return from ultrasound.

## 2014-08-11 NOTE — Progress Notes (Signed)
VASCULAR LAB PRELIMINARY  PRELIMINARY  PRELIMINARY  PRELIMINARY  Left lower extremity venous duplex completed.    Preliminary report:  Left:  No evidence of DVT, superficial thrombosis, or Baker's cyst.  Hannah Hubbard, RVT 08/11/2014, 2:21 PM

## 2015-04-12 ENCOUNTER — Emergency Department (HOSPITAL_COMMUNITY)
Admission: EM | Admit: 2015-04-12 | Discharge: 2015-04-12 | Disposition: A | Discharge status: Home / Self Care | Payer: Medicaid Other | Attending: Emergency Medicine | Admitting: Emergency Medicine

## 2015-04-12 ENCOUNTER — Emergency Department (HOSPITAL_COMMUNITY): Payer: Medicaid Other

## 2015-04-12 ENCOUNTER — Encounter (HOSPITAL_COMMUNITY): Payer: Self-pay | Admitting: Emergency Medicine

## 2015-04-12 DIAGNOSIS — M546 Pain in thoracic spine: Secondary | ICD-10-CM

## 2015-04-12 DIAGNOSIS — F17210 Nicotine dependence, cigarettes, uncomplicated: Secondary | ICD-10-CM | POA: Diagnosis not present

## 2015-04-12 DIAGNOSIS — Z86711 Personal history of pulmonary embolism: Secondary | ICD-10-CM | POA: Diagnosis not present

## 2015-04-12 DIAGNOSIS — Z7901 Long term (current) use of anticoagulants: Secondary | ICD-10-CM | POA: Diagnosis not present

## 2015-04-12 DIAGNOSIS — R079 Chest pain, unspecified: Secondary | ICD-10-CM | POA: Diagnosis present

## 2015-04-12 DIAGNOSIS — Z79899 Other long term (current) drug therapy: Secondary | ICD-10-CM | POA: Insufficient documentation

## 2015-04-12 DIAGNOSIS — Z86718 Personal history of other venous thrombosis and embolism: Secondary | ICD-10-CM | POA: Diagnosis not present

## 2015-04-12 LAB — BASIC METABOLIC PANEL
Anion gap: 9 (ref 5–15)
BUN: 9 mg/dL (ref 6–20)
CO2: 25 mmol/L (ref 22–32)
CREATININE: 0.92 mg/dL (ref 0.44–1.00)
Calcium: 8.9 mg/dL (ref 8.9–10.3)
Chloride: 106 mmol/L (ref 101–111)
GFR calc Af Amer: >60 mL/min (ref >60)
GLUCOSE: 105 mg/dL — AB (ref 65–99)
POTASSIUM: 3.7 mmol/L (ref 3.5–5.1)
Sodium: 140 mmol/L (ref 135–145)

## 2015-04-12 LAB — CBC
HEMATOCRIT: 36.4 % (ref 36.0–46.0)
Hemoglobin: 11.8 g/dL — ABNORMAL LOW (ref 12.0–15.0)
MCH: 26.9 pg (ref 26.0–34.0)
MCHC: 32.4 g/dL (ref 30.0–36.0)
MCV: 83.1 fL (ref 78.0–100.0)
PLATELETS: 268 10*3/uL (ref 150–400)
RBC: 4.38 MIL/uL (ref 3.87–5.11)
RDW: 16.3 % — AB (ref 11.5–15.5)
WBC: 6.1 10*3/uL (ref 4.0–10.5)

## 2015-04-12 LAB — I-STAT TROPONIN, ED: Troponin i, poc: 0 ng/mL (ref 0.00–0.08)

## 2015-04-12 LAB — D-DIMER, QUANTITATIVE: D-Dimer, Quant: 0.27 ug/mL-FEU (ref 0.00–0.50)

## 2015-04-12 MED ORDER — METHOCARBAMOL 500 MG PO TABS
500.0000 mg | ORAL_TABLET | Freq: Once | ORAL | Status: AC
  Administered 2015-04-12: 500 mg via ORAL
  Filled 2015-04-12: qty 1

## 2015-04-12 MED ORDER — KETOROLAC TROMETHAMINE 60 MG/2ML IM SOLN
60.0000 mg | Freq: Once | INTRAMUSCULAR | Status: AC
  Administered 2015-04-12: 60 mg via INTRAMUSCULAR
  Filled 2015-04-12: qty 2

## 2015-04-12 MED ORDER — METHOCARBAMOL 500 MG PO TABS: 500.0000 mg | ORAL_TABLET | Freq: Two times a day (BID) | ORAL | Status: AC

## 2015-04-12 NOTE — Discharge Instructions (Signed)
Schedule a follow up appointment with your PCP for a visit as soon as possible.   Back Pain, Adult Back pain is very common in adults.The cause of back pain is rarely dangerous and the pain often gets better over time.The cause of your back pain may not be known. Some common causes of back pain include:  Strain of the muscles or ligaments supporting the spine.  Wear and tear (degeneration) of the spinal disks.  Arthritis.  Direct injury to the back. For many people, back pain may return. Since back pain is rarely dangerous, most people can learn to manage this condition on their own. HOME CARE INSTRUCTIONS Watch your back pain for any changes. The following actions may help to lessen any discomfort you are feeling:  Remain active. It is stressful on your back to sit or stand in one place for long periods of time. Do not sit, drive, or stand in one place for more than 30 minutes at a time. Take short walks on even surfaces as soon as you are able.Try to increase the length of time you walk each day.  Exercise regularly as directed by your health care provider. Exercise helps your back heal faster. It also helps avoid future injury by keeping your muscles strong and flexible.  Do not stay in bed.Resting more than 1-2 days can delay your recovery.  Pay attention to your body when you bend and lift. The most comfortable positions are those that put less stress on your recovering back. Always use proper lifting techniques, including:  Bending your knees.  Keeping the load close to your body.  Avoiding twisting.  Find a comfortable position to sleep. Use a firm mattress and lie on your side with your knees slightly bent. If you lie on your back, put a pillow under your knees.  Avoid feeling anxious or stressed.Stress increases muscle tension and can worsen back pain.It is important to recognize when you are anxious or stressed and learn ways to manage it, such as with  exercise.  Take medicines only as directed by your health care provider. Over-the-counter medicines to reduce pain and inflammation are often the most helpful.Your health care provider may prescribe muscle relaxant drugs.These medicines help dull your pain so you can more quickly return to your normal activities and healthy exercise.  Apply ice to the injured area:  Put ice in a plastic bag.  Place a towel between your skin and the bag.  Leave the ice on for 20 minutes, 2-3 times a day for the first 2-3 days. After that, ice and heat may be alternated to reduce pain and spasms.  Maintain a healthy weight. Excess weight puts extra stress on your back and makes it difficult to maintain good posture. SEEK MEDICAL CARE IF:  You have pain that is not relieved with rest or medicine.  You have increasing pain going down into the legs or buttocks.  You have pain that does not improve in one week.  You have night pain.  You lose weight.  You have a fever or chills. SEEK IMMEDIATE MEDICAL CARE IF:   You develop new bowel or bladder control problems.  You have unusual weakness or numbness in your arms or legs.  You develop nausea or vomiting.  You develop abdominal pain.  You feel faint.   This information is not intended to replace advice given to you by your health care provider. Make sure you discuss any questions you have with your health care provider.  Document Released: 01/31/2005 Document Revised: 02/21/2014 Document Reviewed: 06/04/2013 Elsevier Interactive Patient Education 2016 [employer]  Chest Wall Pain Chest wall pain is pain in or around the bones and muscles of your chest. Sometimes, an injury causes this pain. Sometimes, the cause may not be known. This pain may take several weeks or longer to get better. HOME CARE INSTRUCTIONS  Pay attention to any changes in your symptoms. Take these actions to help with your pain:   Rest as told by your health care  provider.   Avoid activities that cause pain. These include any activities that use your chest muscles or your abdominal and side muscles to lift heavy items.   If directed, apply ice to the painful area:  Put ice in a plastic bag.  Place a towel between your skin and the bag.  Leave the ice on for 20 minutes, 2-3 times per day.  Take over-the-counter and prescription medicines only as told by your health care provider.  Do not use tobacco products, including cigarettes, chewing tobacco, and e-cigarettes. If you need help quitting, ask your health care provider.  Keep all follow-up visits as told by your health care provider. This is important. SEEK MEDICAL CARE IF:  You have a fever.  Your chest pain becomes worse.  You have new symptoms. SEEK IMMEDIATE MEDICAL CARE IF:  You have nausea or vomiting.  You feel sweaty or light-headed.  You have a cough with phlegm (sputum) or you cough up blood.  You develop shortness of breath.   This information is not intended to replace advice given to you by your health care provider. Make sure you discuss any questions you have with your health care provider.   Document Released: 01/31/2005 Document Revised: 10/22/2014 Document Reviewed: 04/28/2014 Elsevier Interactive Patient Education 2016 [employer]  Chest Pain Observation It is often hard to give a specific diagnosis for the cause of chest pain. Among other possibilities your symptoms might be caused by inadequate oxygen delivery to your heart (angina). Angina that is not treated or evaluated can lead to a heart attack (myocardial infarction) or death. Blood tests, electrocardiograms, and X-rays may have been done to help determine a possible cause of your chest pain. After evaluation and observation, your health care provider has determined that it is unlikely your pain was caused by an unstable condition that requires hospitalization. However, a full evaluation of your pain  may need to be completed, with additional diagnostic testing as directed. It is very important to keep your follow-up appointments. Not keeping your follow-up appointments could result in permanent heart damage, disability, or death. If there is any problem keeping your follow-up appointments, you must call your health care provider. HOME CARE INSTRUCTIONS  Due to the slight chance that your pain could be angina, it is important to follow your health care provider's treatment plan and also maintain a healthy lifestyle:  Maintain or work toward achieving a healthy weight.  Stay physically active and exercise regularly.  Decrease your salt intake.  Eat a balanced, healthy diet. Talk to a dietitian to learn about heart-healthy foods.  Increase your fiber intake by including whole grains, vegetables, fruits, and nuts in your diet.  Avoid situations that cause stress, anger, or depression.  Take medicines as advised by your health care provider. Report any side effects to your health care provider. Do not stop medicines or adjust the dosages on your own.  Quit smoking. Do not use nicotine patches or gum until you  check with your health care provider.  Keep your blood pressure, blood sugar, and cholesterol levels within normal limits.  Limit alcohol intake to no more than 1 drink per day for women who are not pregnant and 2 drinks per day for men.  Do not abuse drugs. SEEK IMMEDIATE MEDICAL CARE IF: You have severe chest pain or pressure which may include symptoms such as:  You feel pain or pressure in your arms, neck, jaw, or back.  You have severe back or abdominal pain, feel sick to your stomach (nauseous), or throw up (vomit).  You are sweating profusely.  You are having a fast or irregular heartbeat.  You feel short of breath while at rest.  You notice increasing shortness of breath during rest, sleep, or with activity.  You have chest pain that does not get better after rest  or after taking your usual medicine.  You wake from sleep with chest pain.  You are unable to sleep because you cannot breathe.  You develop a frequent cough or you are coughing up blood.  You feel dizzy, faint, or experience extreme fatigue.  You develop severe weakness, dizziness, fainting, or chills. Any of these symptoms may represent a serious problem that is an emergency. Do not wait to see if the symptoms will go away. Call your local emergency services (911 in the [place] Do not drive yourself to the hospital. MAKE SURE YOU:  Understand these instructions.  Will watch your condition.  Will get help right away if you are not doing well or get worse.   This information is not intended to replace advice given to you by your health care provider. Make sure you discuss any questions you have with your health care provider.   Document Released: 03/05/2010 Document Revised: 02/05/2013 Document Reviewed: 08/02/2012 Elsevier Interactive Patient Education [employer]

## 2015-04-12 NOTE — ED Notes (Signed)
Pt from home with c/o chest pain/back pain starting last night.  Pt reports worsening pain with movement and possibly caused by "sleeping wrong."  Pt reports increased pain with eating solid food.  NAD, A&O, ambulatory.

## 2015-04-12 NOTE — ED Provider Notes (Signed)
CSN: [id number]     Arrival date & time 04/12/15  1035 History   First MD Initiated Contact with Patient 04/12/15 1120     Chief Complaint  Patient presents with  . Chest Pain  . Back Pain   HPI  Hannah Hubbard is a 30 year old female with PMHx of PE and DVT presenting with chest and back pain. Onset of symptoms was last evening. She states that the pain is predominantly in her back and she thinks that it radiates to her chest. She states that the pain is very similar so she is not sure exactly where it is originating from. The pain is all left-sided. Described as a severe, sharp pain. Pain is exacerbated by deep inspiration, leaning forward, coughing and moving her upper torso. Resting and sitting up straight alleviates the pain somewhat. She also notes that when she swallows solid food she feels a central chest discomfort. She states that she does not have a sensation of food stuck in her chest that it is just mildly uncomfortable and she feels like it is going down slower than usual. She reports a history of GERD and states that it feels similar but she is "not sure if it's the same sensation fully". The chest and back pain do not radiate into the neck, upper extremities or the abdomen. She denies recent trauma or strenuous activity. 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 She is supposed be taking Xarelto but has not taken it in the past 2 weeks. She notes that she has taken 2 of her mother's Coumadin pills; one yesterday and one last week. She does admit to smoking cigarettes. Denies shortness of breath, calf pain, calf swelling or color change over the calves. Denies recent long travel, hormonal birth control or immobilization. She states that her right leg is chronically more swollen than the left and this is unchanged today. Denies fevers, chills, headaches, dizziness, lightheadedness, syncope, URI symptoms, neck pain, upper extremity pain, numbness in extremities, weakness of extremities, cough,  abdominal pain, nausea, vomiting, dysuria, generalized myalgias or skin changes.   Past Medical History  Diagnosis Date  . Pulmonary embolism (HCC)   . DVT (deep venous thrombosis) (HCC)   . Gout    Past Surgical History  Procedure Laterality Date  . Btl    . Cesarean section     History reviewed. No pertinent family history. Social History  Substance Use Topics  . Smoking status: Current Every Day Smoker    Types: Cigars  . Smokeless tobacco: Never Used  . Alcohol Use: Yes     Comment: ocassionally   OB History    No data available     Review of Systems  Constitutional: Negative for fever and chills.  HENT: Negative for congestion, rhinorrhea and sore throat.   Eyes: Negative for visual disturbance.  Respiratory: Negative for cough and shortness of breath.   Cardiovascular: Positive for chest pain. Negative for leg swelling.  Gastrointestinal: Negative for nausea, vomiting and abdominal pain.  Musculoskeletal: Positive for back pain.  Skin: Negative for color change.  Neurological: Negative for dizziness, syncope, weakness, light-headedness, numbness and headaches.  All other systems reviewed and are negative.     Allergies  Estrogens  Home Medications   Prior to Admission medications   Medication Sig Start Date End Date Taking? Authorizing Provider  Aspirin-Salicylamide-Caffeine (BC HEADACHE POWDER PO) Take by mouth.   Yes Historical Provider, MD  Rivaroxaban (XARELTO) 15 MG TABS tablet Take 15 mg  by mouth daily.   Yes Historical Provider, MD  traMADol (ULTRAM) 50 MG tablet Take 100 mg by mouth once.   Yes Historical Provider, MD  warfarin (COUMADIN) 10 MG tablet Take 10 mg by mouth once.   Yes Historical Provider, MD  methocarbamol (ROBAXIN) 500 MG tablet Take 1 tablet (500 mg total) by mouth 2 (two) times daily. 04/12/15   Ayleen Mckinstry, PA-C   BP 118/76 mmHg  Pulse 73  Temp(Src) 98.2 F (36.8 C) (Oral)  Resp 15  SpO2 100%  LMP 03/19/2015  (Approximate) Physical Exam  Constitutional: She appears well-developed and well-nourished. No distress.  Nontoxic appearing  HENT:  Head: Normocephalic and atraumatic.  Mouth/Throat: Oropharynx is clear and moist.  Eyes: Conjunctivae and EOM are normal. Right eye exhibits no discharge. Left eye exhibits no discharge. No scleral icterus.  Neck: Normal range of motion. Neck supple.  Cardiovascular: Normal rate, regular rhythm and normal heart sounds.   Pulmonary/Chest: Effort normal and breath sounds normal. No respiratory distress. She has no wheezes. She has no rales.   She exhibits tenderness. She exhibits no crepitus, no deformity, no swelling and no retraction.    Breathing unlabored. Lungs CTAB. Generalized tenderness over left posterior and anterior chest wall. Tenderness more significant over the back. No bony deformities of T spine or rib cage. No overlying skin changes.   Abdominal: Soft. There is no tenderness. There is no rebound and no guarding.  Musculoskeletal: Normal range of motion.  Joints are supple without edema or deformity. Moves all extremities spontaneously and without pain.   Neurological: She is alert. Coordination normal.  Skin: Skin is warm and dry.  Psychiatric: She has a normal mood and affect. Her behavior is normal.  Nursing note and vitals reviewed.   ED Course  Procedures (including critical care time) Labs Review Labs Reviewed  BASIC METABOLIC PANEL - Abnormal; Notable for the following:    Glucose, Bld 105 (*)    All other components within normal limits  CBC - Abnormal; Notable for the following:    Hemoglobin 11.8 (*)    RDW 16.3 (*)    All other components within normal limits  D-DIMER, QUANTITATIVE (NOT AT [facility])  Rosezena Sensor, ED    Imaging Review Dg Chest 2 View  04/12/2015  CLINICAL DATA:  Acute chest pain for 1 day. EXAM: CHEST  2 VIEW COMPARISON:  11/10/2008 FINDINGS: The cardiomediastinal silhouette is unremarkable. Mild  peribronchial thickening is unchanged. There is no evidence of focal airspace disease, pulmonary edema, suspicious pulmonary nodule/mass, pleural effusion, or pneumothorax. No acute bony abnormalities are identified. IMPRESSION: No evidence of acute cardiopulmonary disease. Mild chronic peribronchial thickening. Electronically Signed   By: Harmon Pier M.D.   On: 04/12/2015 11:30   I have personally reviewed and evaluated these images and lab results as part of my medical decision-making.   EKG Interpretation None      MDM   Final diagnoses:  Left-sided thoracic back pain  Chest pain, unspecified chest pain type   Patient presenting with left sided back and chest pain x 1 day. Worsened by movement, palpation and deep inspiration. Also with complaints of discomfort while eating that "might be my GERD but I'm not sure". Hx of DVTs/PEs and noncompliance with her anticoagulants. Admits to no xarelto in past 2 weeks due to "pharmacy issues". VSS. No tachycardia or decreased O2 saturation. Moderate tenderness over left thoracic back and left chest wall. Pain is easily reproducible with no underlying bony abnormalities. Bilateral  upper extremities are neurovascularly intact with FROM. Heart RRR without murmur. Lungs CTAB. Troponin negative with non-iscehmic ECG. CXR without abnormality. D-dimer 0.27. Presentation and workup is not consistent with cardiac or pulmonary pain origin; appears to be entirely musculoskeletal. Pain improved with toradol and robaxin in ED. Will discharge with symptomatic treatment and strict instructions to follow up with her PCP for monitoring of her xarelto compliance and symptoms. Pt has been advised to return to the ED if CP becomes exertional, associated with diaphoresis or nausea, radiates to left jaw/arm, worsens or becomes concerning in any way. Pt appears reliable for follow up and is agreeable to discharge.   Case has been discussed with and seen by Dr. Estell Harpin who agrees  with the above plan to discharge.      Alveta Heimlich, PA-C 04/12/15 1501  Bethann Berkshire, MD 04/12/15 1556

## 2017-12-15 IMAGING — DX DG CHEST 2V
2 series · 2 of 2 positions shown · non-contrast
Comparison: 11/10/2008

CLINICAL DATA: Acute chest pain for 1 day.

EXAM:
CHEST  2 VIEW

[chest pa]
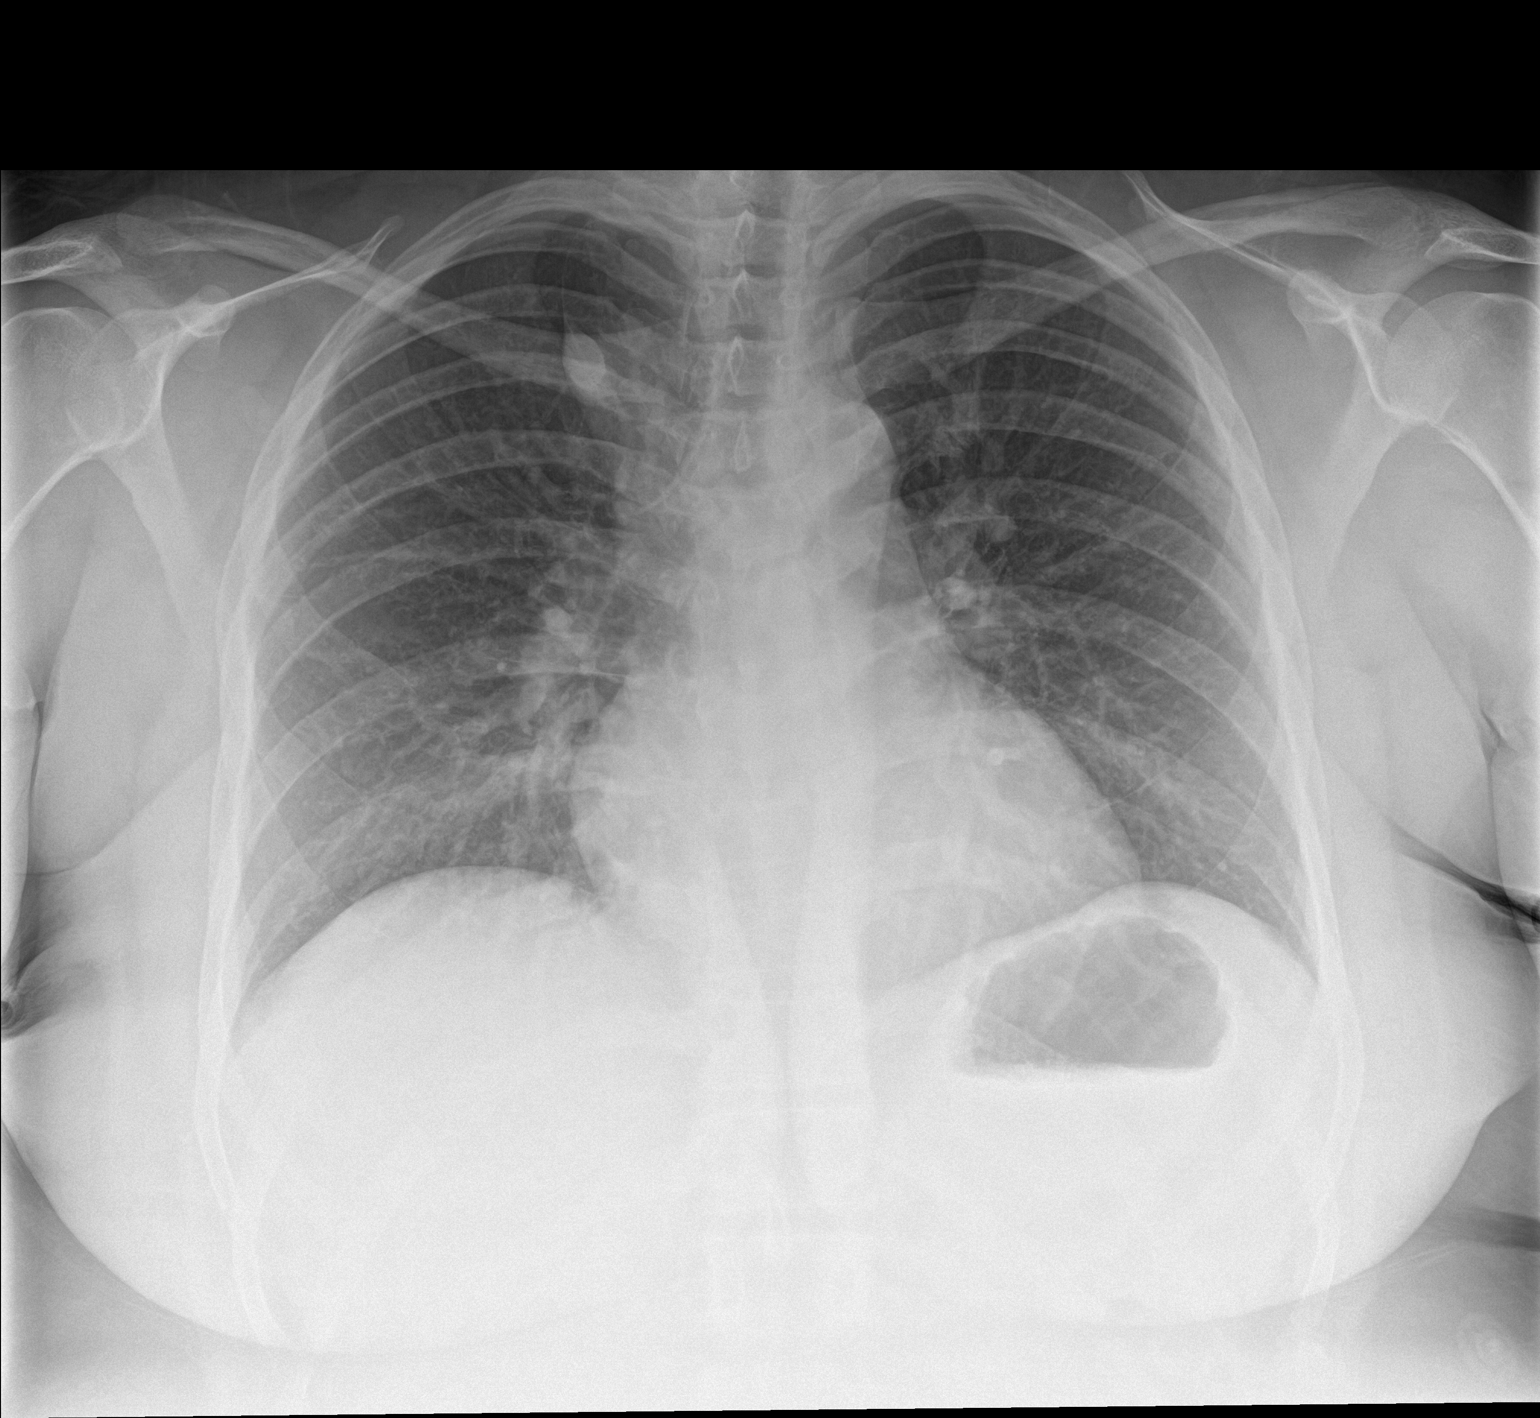

[chest lat]
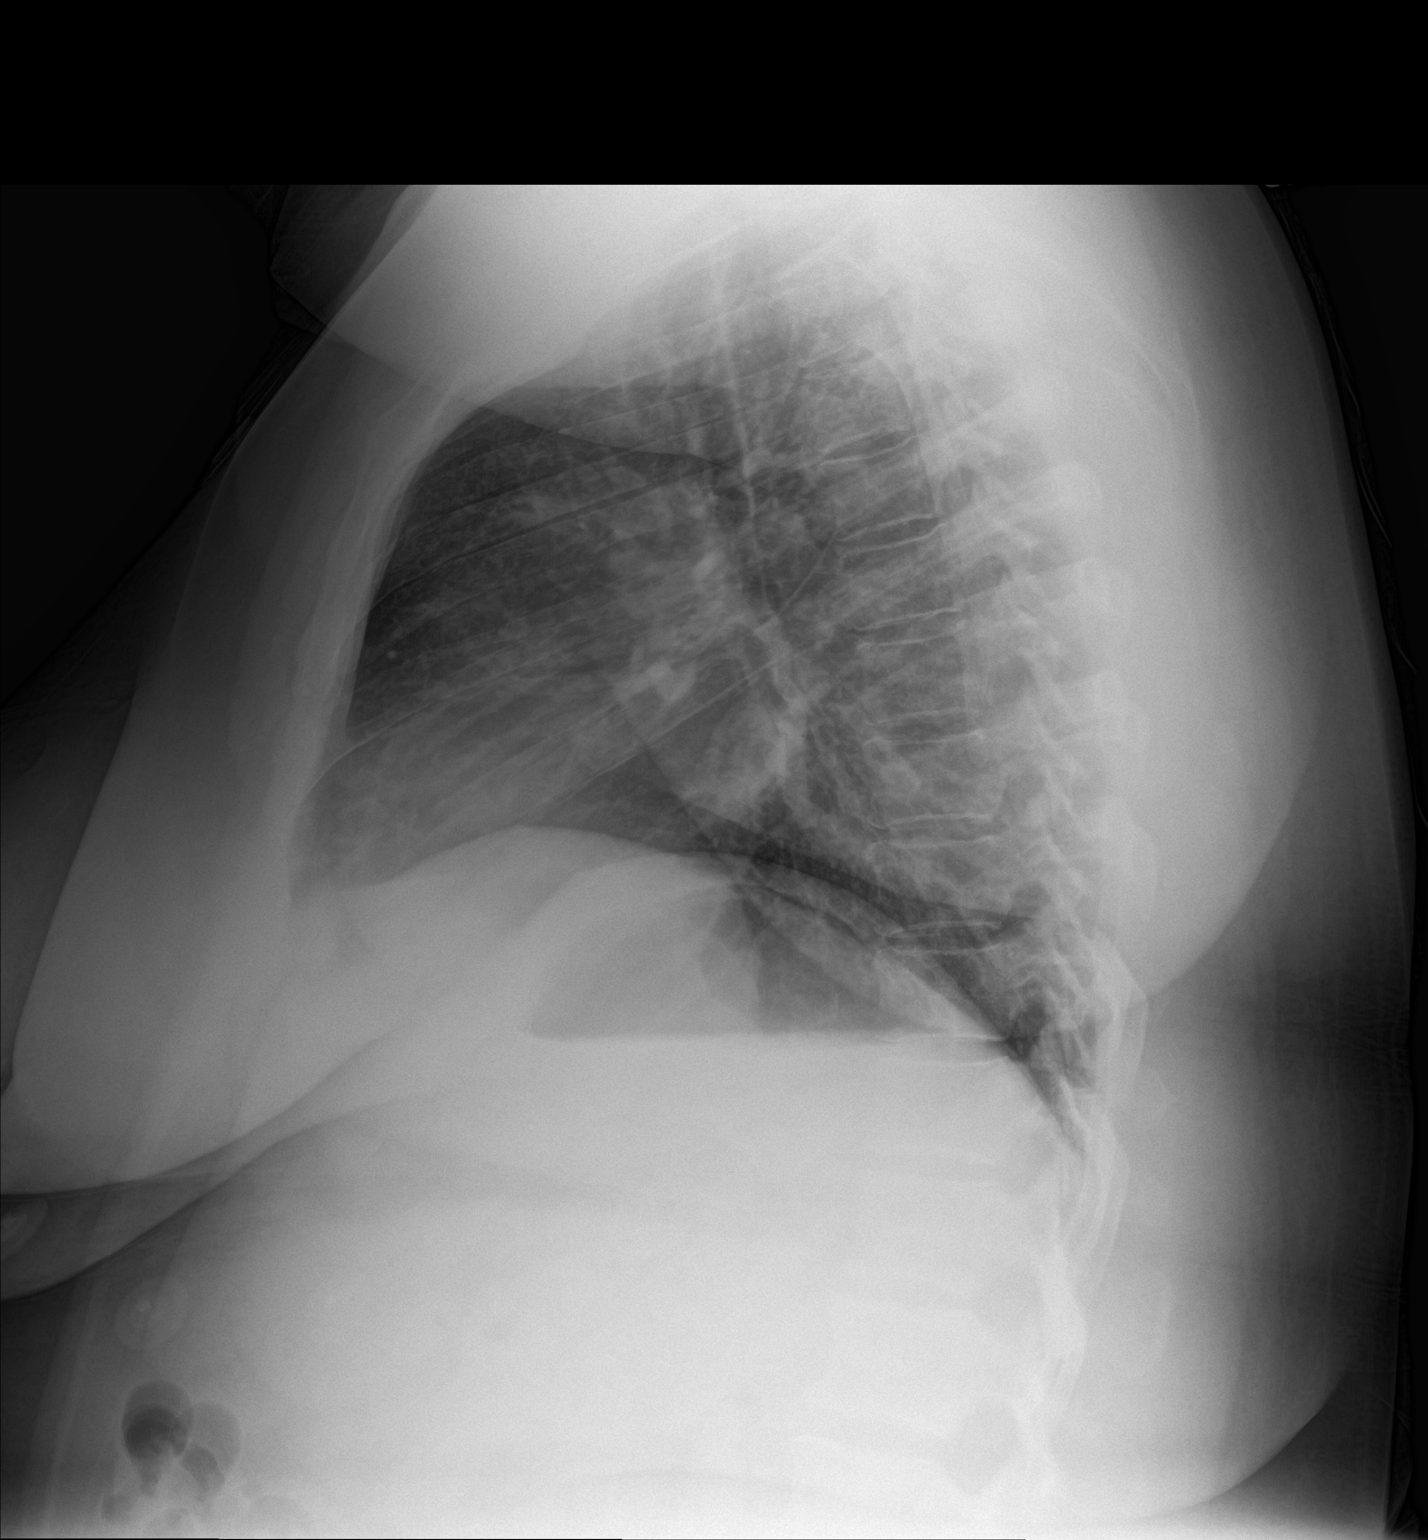

[2 of 2 positions shown; findings below may reference images not displayed]

FINDINGS: The cardiomediastinal silhouette is unremarkable.

Mild peribronchial thickening is unchanged.

There is no evidence of focal airspace disease, pulmonary edema,
suspicious pulmonary nodule/mass, pleural effusion, or pneumothorax.
No acute bony abnormalities are identified.
IMPRESSION: No evidence of acute cardiopulmonary disease.

Mild chronic peribronchial thickening.

## 2018-02-19 ENCOUNTER — Emergency Department (HOSPITAL_COMMUNITY): Payer: Medicaid Other

## 2018-02-19 ENCOUNTER — Encounter (HOSPITAL_COMMUNITY): Payer: Self-pay | Admitting: *Deleted

## 2018-02-19 ENCOUNTER — Emergency Department (HOSPITAL_COMMUNITY)
Admission: EM | Admit: 2018-02-19 | Discharge: 2018-02-19 | Disposition: A | Discharge status: Home / Self Care | Payer: Medicaid Other | Attending: Emergency Medicine | Admitting: Emergency Medicine

## 2018-02-19 DIAGNOSIS — Z79899 Other long term (current) drug therapy: Secondary | ICD-10-CM | POA: Insufficient documentation

## 2018-02-19 DIAGNOSIS — F17210 Nicotine dependence, cigarettes, uncomplicated: Secondary | ICD-10-CM | POA: Insufficient documentation

## 2018-02-19 DIAGNOSIS — M79671 Pain in right foot: Secondary | ICD-10-CM | POA: Insufficient documentation

## 2018-02-19 DIAGNOSIS — Z7982 Long term (current) use of aspirin: Secondary | ICD-10-CM | POA: Insufficient documentation

## 2018-02-19 MED ORDER — OXYCODONE-ACETAMINOPHEN 5-325 MG PO TABS
1.0000 | ORAL_TABLET | Freq: Once | ORAL | Status: AC
  Administered 2018-02-19: 1 via ORAL
  Filled 2018-02-19: qty 1

## 2018-02-19 MED ORDER — ACETAMINOPHEN 325 MG PO TABS: 650.0000 mg | ORAL_TABLET | Freq: Four times a day (QID) | ORAL | 0 refills | Status: AC | PRN

## 2018-02-19 NOTE — ED Provider Notes (Signed)
[facility] EMERGENCY DEPARTMENT Provider Note   CSN: [id number] Arrival date & time: 02/19/18  9518     History   Chief Complaint Chief Complaint  Patient presents with  . Foot Pain    HPI Kayse Deanda is a 33 y.o. female.  HPI    33 year old female with past medical history of DVT, PE, obesity presents today with complaints of right foot pain.  Patient notes she woke up yesterday with pain in the right dorsal foot.  She notes this is worse with range of motion of the ankle and foot worse with palpation.  She denies any swelling or edema, denies any calf pain or swelling.  She notes no swelling to the foot, remainder of foot and ankle and lower extremity nontender.  She denies any trauma, notes taking aspirin at home without significant improvement in her symptoms.  Reports she is currently on Xarelto, and has not had a DVT for approximately 3 years.    Past Medical History:  Diagnosis Date  . DVT (deep venous thrombosis) (HCC)   . Gout   . Pulmonary embolism [facility])     Patient Active Problem List   Diagnosis Date Noted  . Pulmonary embolism (HCC)   . DVT (deep venous thrombosis) (HCC)   . OBESITY 11/20/2008  . PULMONARY EMBOLISM 11/20/2008  . DVT 11/20/2008    Past Surgical History:  Procedure Laterality Date  . Btl    . CESAREAN SECTION       OB History   No obstetric history on file.      Home Medications    Prior to Admission medications   Medication Sig Start Date End Date Taking? Authorizing Provider  acetaminophen (TYLENOL) 325 MG tablet Take 2 tablets (650 mg total) by mouth every 6 (six) hours as needed. 02/19/18   Jahn Franchini, Tinnie Gens, PA-C  Aspirin-Salicylamide-Caffeine (BC HEADACHE POWDER PO) Take by mouth.    [provider]  methocarbamol (ROBAXIN) 500 MG tablet Take 1 tablet (500 mg total) by mouth 2 (two) times daily. 04/12/15   Barrett, Rolm Gala, PA-C  Rivaroxaban (XARELTO) 15 MG TABS tablet Take 15 mg by mouth daily.     [provider]  traMADol (ULTRAM) 50 MG tablet Take 100 mg by mouth once.    [provider]  warfarin (COUMADIN) 10 MG tablet Take 10 mg by mouth once.    [provider]    Family History No family history on file.  Social History Social History   Tobacco Use  . Smoking status: Current Every Day Smoker    Types: Cigars  . Smokeless tobacco: Never Used  Substance Use Topics  . Alcohol use: Yes    Comment: ocassionally  . Drug use: No     Allergies   Estrogens   Review of Systems Review of Systems  All other systems reviewed and are negative.  Physical Exam Updated Vital Signs BP (!) 141/82 (BP Location: Right Arm)   Pulse 86   Temp 98.8 F (37.1 C) (Oral)   Resp 14   SpO2 99%   Physical Exam Vitals signs and nursing note reviewed.  Constitutional:      Appearance: She is well-developed.  HENT:     Head: Normocephalic and atraumatic.  Eyes:     General: No scleral icterus.       Right eye: No discharge.        Left eye: No discharge.     Conjunctiva/sclera: Conjunctivae normal.  Pupils: Pupils are equal, round, and reactive to light.  Neck:     Musculoskeletal: Normal range of motion.     Vascular: No JVD.     Trachea: No tracheal deviation.  Pulmonary:     Effort: Pulmonary effort is normal.     Breath sounds: No stridor.  Musculoskeletal:     Comments: Tenderness palpation of the right dorsal foot at the proximal fourth metacarpal, no significant swelling or edema, no redness, no warmth to touch-remainder of foot nontender atraumatic ankle nontender, calf nontender-pain to the dorsal foot with plantar flexion and inversion of the ankle  Neurological:     Mental Status: She is alert and oriented to person, place, and time.     Coordination: Coordination normal.  Psychiatric:        Behavior: Behavior normal.        Thought Content: Thought content normal.        Judgment: Judgment normal.      ED Treatments /  Results  Labs (all labs ordered are listed, but only abnormal results are displayed) Labs Reviewed - No data to display  EKG None  Radiology Dg Foot Complete Right  Result Date: 02/19/2018 CLINICAL DATA:  Lateral foot pain, no known injury, initial encounter EXAM: RIGHT FOOT COMPLETE - 3+ VIEW COMPARISON:  10/15/2004 FINDINGS: There is no evidence of fracture or dislocation. There is no evidence of arthropathy or other focal bone abnormality. Soft tissues are unremarkable. IMPRESSION: No acute abnormality noted. Electronically Signed   By: Alcide Clever M.D.   On: 02/19/2018 10:59    Procedures Procedures (including critical care time)  Medications Ordered in ED Medications  oxyCODONE-acetaminophen (PERCOCET/ROXICET) 5-325 MG per tablet 1 tablet (1 tablet Oral Given 02/19/18 1102)     Initial Impression / Assessment and Plan / ED Course  I have reviewed the triage vital signs and the nursing notes.  Pertinent labs & imaging results that were available during my care of the patient were reviewed by me and considered in my medical decision making (see chart for details).     Labs:   Imaging: DG foot complete right Consults:  Therapeutics:  Discharge Meds:   Assessment/Plan: 33 year old female presents today with complaints of right foot pain.  Patient does have tenderness along the lateral foot.  And very low suspicion for any infectious etiology, no signs of DVT.  Patient's pain likely ankle sprain.  Patient encouraged to return immediately if she develops any new or worsening signs or symptoms.  Patient on crutches here, she will return immediately if develops any new or worsening signs or symptoms.  Encouraged use Tylenol at home as needed.     Final Clinical Impressions(s) / ED Diagnoses   Final diagnoses:  Foot pain, right    ED Discharge Orders         Ordered    acetaminophen (TYLENOL) 325 MG tablet  Every 6 hours PRN     02/19/18 1133           Eyvonne Mechanic, PA-C 02/19/18 1417    Azalia Bilis, MD 02/19/18 1708

## 2018-02-19 NOTE — Discharge Instructions (Addendum)
Please read attached information. If you experience any new or worsening signs or symptoms please return to the emergency room for evaluation. Please follow-up with your primary care provider or specialist as discussed. Please use medication prescribed only as directed and discontinue taking if you have any concerning signs or symptoms.   °

## 2018-02-19 NOTE — ED Triage Notes (Signed)
Pt c/o lateral R foot pain without injury, hx of dvt takes xerelto, no redness noted, swelling at baseline per pt, A&O x4

## 2020-10-24 IMAGING — DX DG FOOT COMPLETE 3+V*R*
3 series · 3 of 3 positions shown · non-contrast
Comparison: 10/15/2004

CLINICAL DATA: Lateral foot pain, no known injury, initial
encounter

EXAM:
RIGHT FOOT COMPLETE - 3+ VIEW

[foot ap]
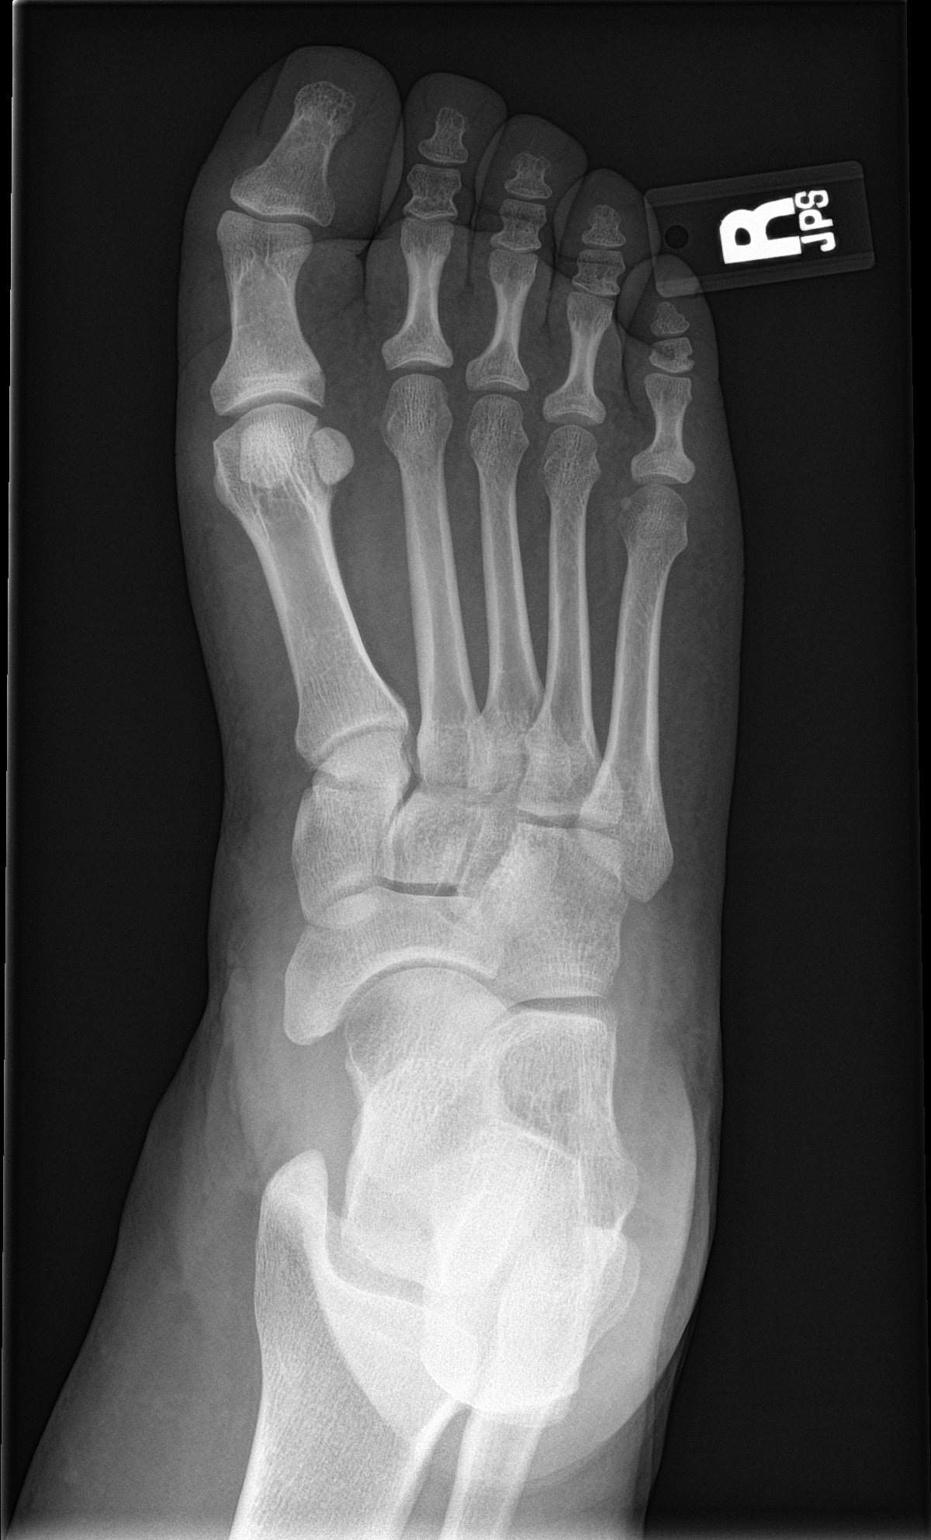

[foot obl]
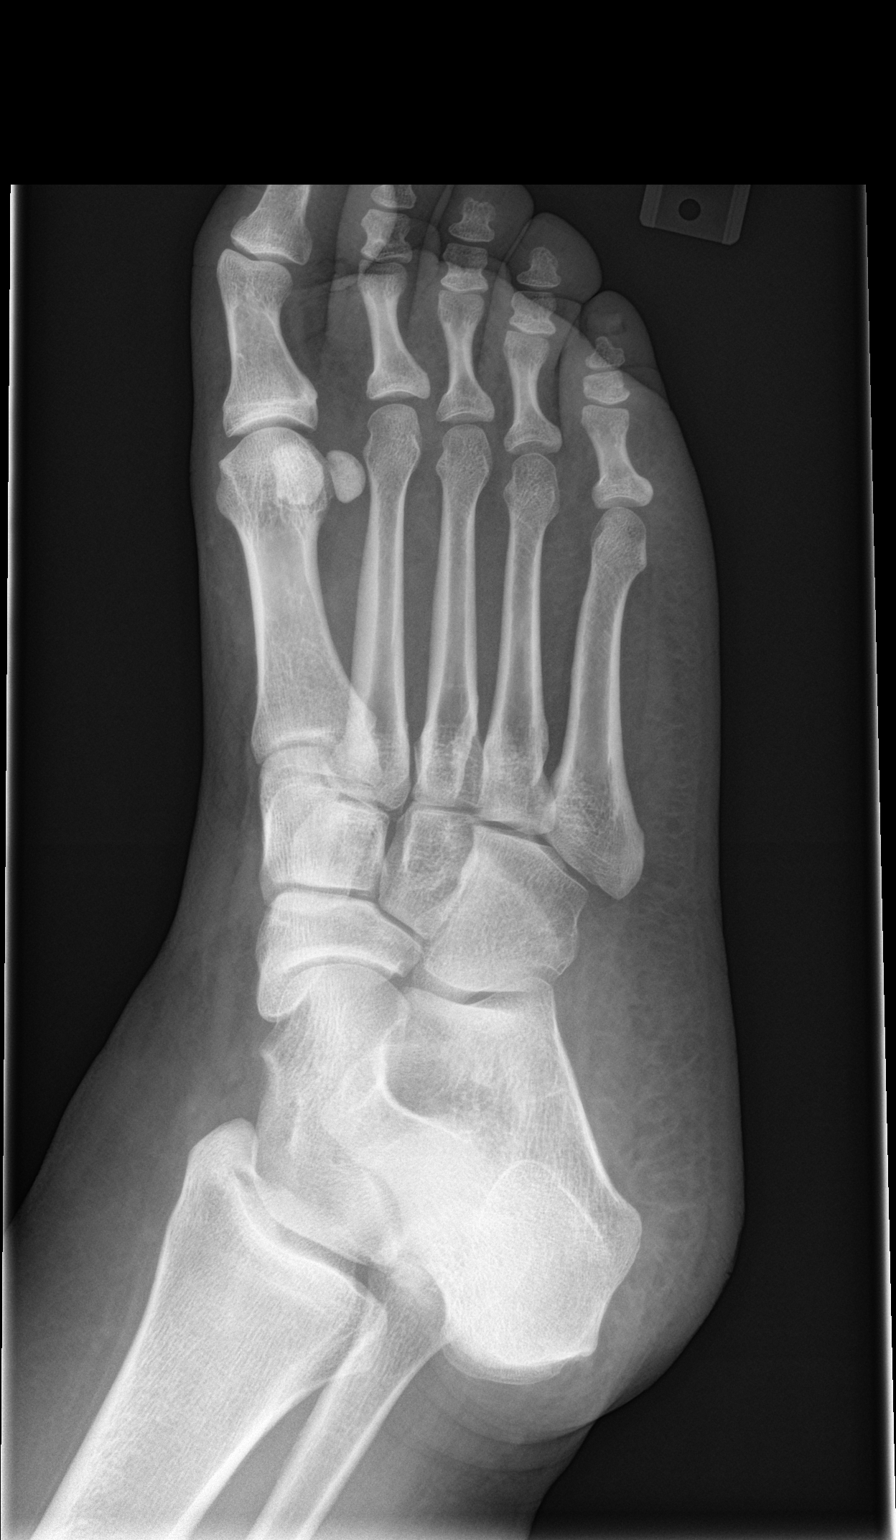

[foot lat]
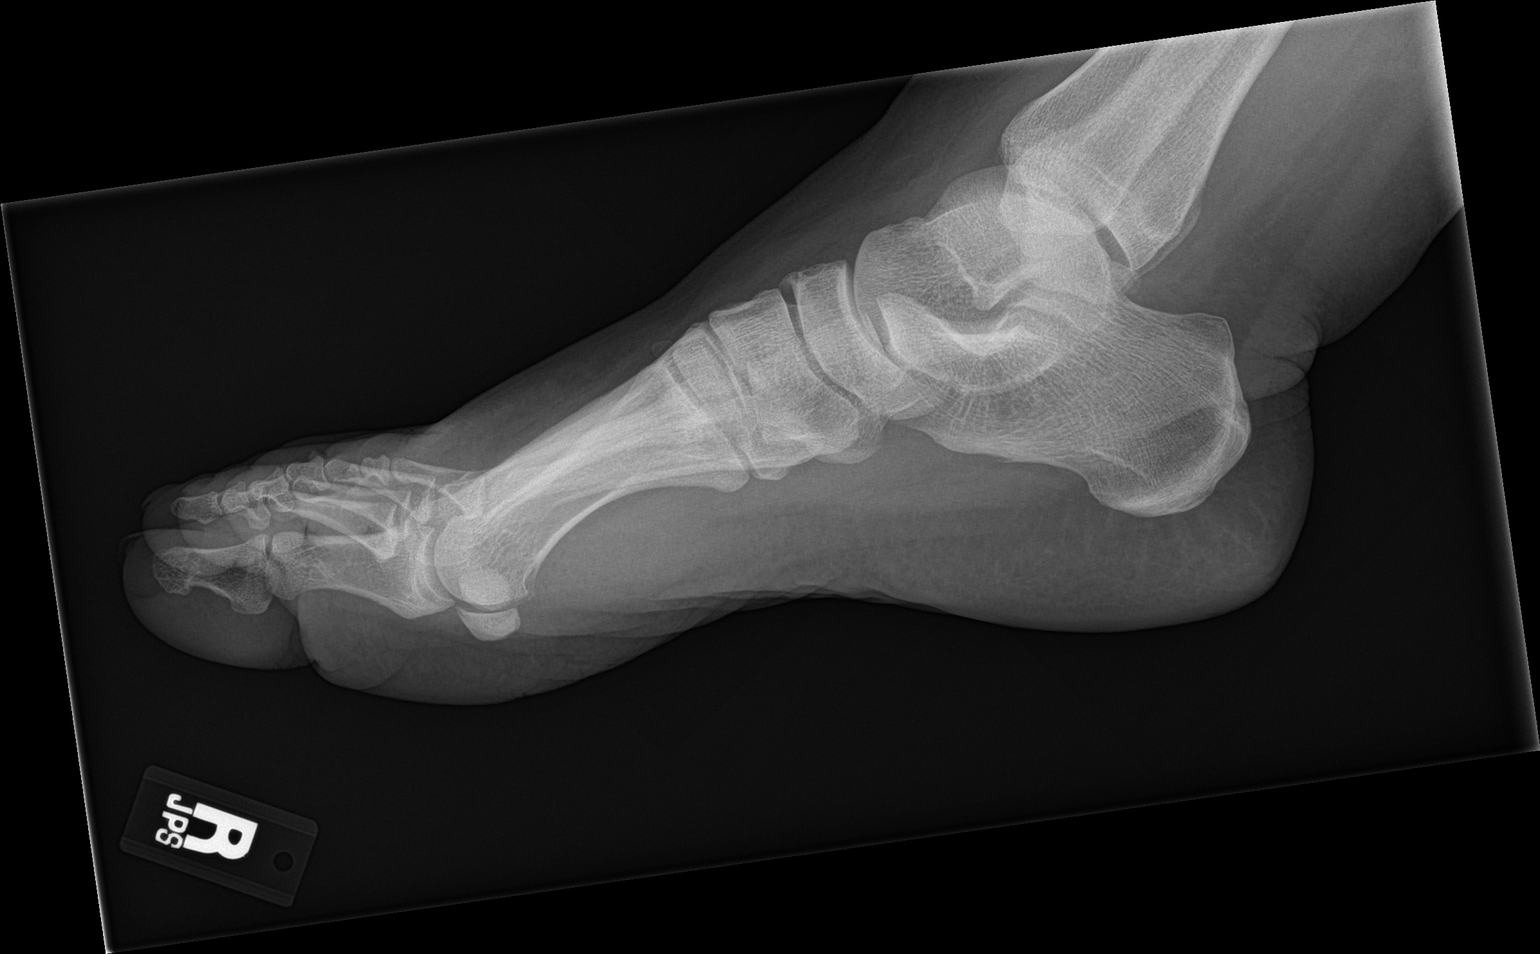

[3 of 3 positions shown; findings below may reference images not displayed]

FINDINGS: There is no evidence of fracture or dislocation. There is no
evidence of arthropathy or other focal bone abnormality. Soft
tissues are unremarkable.
IMPRESSION: No acute abnormality noted.

## 2022-02-14 DEATH — deceased
